# Patient Record
Sex: Male | Born: 1960 | Race: White | Hispanic: No | Marital: Single | Smoking: Light tobacco smoker
Health system: Southern US, Community
[De-identification: ages and names within clinical notes are randomized; demographics above are authoritative.]

## PROBLEM LIST (undated history)

## (undated) DIAGNOSIS — I10 Essential (primary) hypertension: Secondary | ICD-10-CM

## (undated) DIAGNOSIS — E119 Type 2 diabetes mellitus without complications: Secondary | ICD-10-CM

## (undated) DIAGNOSIS — J189 Pneumonia, unspecified organism: Secondary | ICD-10-CM

## (undated) DIAGNOSIS — E785 Hyperlipidemia, unspecified: Secondary | ICD-10-CM

## (undated) HISTORY — PX: LUNG BIOPSY: SHX232

## (undated) HISTORY — DX: Pneumonia, unspecified organism: J18.9

## (undated) HISTORY — DX: Type 2 diabetes mellitus without complications: E11.9

## (undated) HISTORY — DX: Essential (primary) hypertension: I10

## (undated) HISTORY — DX: Hyperlipidemia, unspecified: E78.5

## (undated) HISTORY — PX: TIBIA FRACTURE SURGERY: SHX806

---

## 1997-08-13 ENCOUNTER — Ambulatory Visit (INDEPENDENT_AMBULATORY_CARE_PROVIDER_SITE_OTHER): Admit: 1997-08-13 | Disposition: A | Discharge status: Home / Self Care | Payer: Self-pay | Source: Ambulatory Visit

## 1997-08-27 ENCOUNTER — Encounter: Payer: Self-pay | Admitting: Family Medicine

## 1998-10-27 ENCOUNTER — Emergency Department: Admit: 1998-10-27 | Disposition: A | Discharge status: Home / Self Care | Payer: Self-pay | Source: Ambulatory Visit

## 1998-10-28 ENCOUNTER — Emergency Department: Admit: 1998-10-28 | Disposition: A | Discharge status: Home / Self Care | Payer: Self-pay | Source: Ambulatory Visit

## 2001-09-24 ENCOUNTER — Encounter: Payer: Self-pay | Admitting: Family Medicine

## 2003-08-05 ENCOUNTER — Encounter: Payer: Self-pay | Admitting: Family Medicine

## 2003-11-10 ENCOUNTER — Emergency Department
Admission: EM | Admit: 2003-11-10 | Disposition: A | Discharge status: Home / Self Care | Payer: Self-pay | Source: Ambulatory Visit

## 2005-03-07 ENCOUNTER — Ambulatory Visit: Payer: Self-pay | Admitting: Family Medicine

## 2005-12-19 ENCOUNTER — Ambulatory Visit: Payer: Self-pay | Admitting: Family Medicine

## 2006-08-09 ENCOUNTER — Emergency Department
Admission: EM | Admit: 2006-08-09 | Disposition: A | Discharge status: Home / Self Care | Payer: Self-pay | Source: Ambulatory Visit

## 2006-08-10 ENCOUNTER — Ambulatory Visit
Admission: RE | Admit: 2006-08-10 | Disposition: A | Discharge status: Home / Self Care | Payer: Self-pay | Source: Ambulatory Visit | Admitting: Orthopaedic Surgery

## 2006-08-22 ENCOUNTER — Ambulatory Visit
Admission: RE | Admit: 2006-08-22 | Disposition: A | Discharge status: Home / Self Care | Payer: Self-pay | Source: Ambulatory Visit

## 2007-01-05 ENCOUNTER — Ambulatory Visit: Payer: Self-pay | Admitting: Family Medicine

## 2008-04-05 ENCOUNTER — Encounter: Payer: Self-pay | Admitting: Family Medicine

## 2008-07-31 ENCOUNTER — Encounter: Payer: Self-pay | Admitting: Family Medicine

## 2008-07-31 DIAGNOSIS — E785 Hyperlipidemia, unspecified: Secondary | ICD-10-CM | POA: Insufficient documentation

## 2008-08-04 ENCOUNTER — Ambulatory Visit: Payer: Self-pay | Admitting: Family Medicine

## 2008-08-04 DIAGNOSIS — I10 Essential (primary) hypertension: Secondary | ICD-10-CM | POA: Insufficient documentation

## 2009-01-05 ENCOUNTER — Encounter: Payer: Self-pay | Admitting: Family Medicine

## 2009-02-10 ENCOUNTER — Ambulatory Visit: Payer: Self-pay | Admitting: Family Medicine

## 2009-02-10 LAB — CONVERTED CEMR LAB
Bacteria, UA: 0
Glucose, Urine, Semiquant: NEGATIVE
RBC / HPF: 0
Specific Gravity, Urine: 1.025
pH: 5

## 2009-02-11 ENCOUNTER — Encounter: Payer: Self-pay | Admitting: Family Medicine

## 2009-02-12 ENCOUNTER — Ambulatory Visit: Payer: Self-pay | Admitting: Family Medicine

## 2009-02-13 ENCOUNTER — Encounter: Payer: Self-pay | Admitting: Family Medicine

## 2009-02-16 ENCOUNTER — Telehealth: Payer: Self-pay | Admitting: Family Medicine

## 2009-02-17 LAB — CONVERTED CEMR LAB
ALT: 36 units/L (ref 0–53)
AST: 20 units/L (ref 0–37)
BUN: 13 mg/dL (ref 6–23)
CO2: 28 meq/L (ref 19–32)
Chloride: 107 meq/L (ref 96–112)
Direct LDL: 165.5 mg/dL
Eosinophils Absolute: 0.2 10*3/uL (ref 0.0–0.7)
Eosinophils Relative: 3.8 % (ref 0.0–5.0)
Glucose, Bld: 98 mg/dL (ref 70–99)
MCV: 84.8 fL (ref 78.0–100.0)
Monocytes Absolute: 0.4 10*3/uL (ref 0.1–1.0)
Neutrophils Relative %: 54.6 % (ref 43.0–77.0)
Platelets: 160 10*3/uL (ref 150.0–400.0)
Potassium: 4.5 meq/L (ref 3.5–5.1)
TSH: 3.9 microintl units/mL (ref 0.35–5.50)
Total Bilirubin: 1 mg/dL (ref 0.3–1.2)
WBC: 5 10*3/uL (ref 4.5–10.5)

## 2009-02-24 ENCOUNTER — Ambulatory Visit: Payer: Self-pay | Admitting: Family Medicine

## 2009-10-07 ENCOUNTER — Ambulatory Visit (INDEPENDENT_AMBULATORY_CARE_PROVIDER_SITE_OTHER): Admit: 2009-10-07 | Disposition: A | Discharge status: Home / Self Care | Payer: Self-pay | Source: Ambulatory Visit

## 2010-05-03 ENCOUNTER — Encounter (INDEPENDENT_AMBULATORY_CARE_PROVIDER_SITE_OTHER): Payer: Self-pay | Admitting: *Deleted

## 2010-08-11 ENCOUNTER — Telehealth: Payer: Self-pay | Admitting: Family Medicine

## 2010-08-13 ENCOUNTER — Ambulatory Visit: Payer: Self-pay | Admitting: Family Medicine

## 2010-10-26 NOTE — Assessment & Plan Note (Signed)
Summary: PT TRANSFER FROM DR SCHALLER/CHECK   Vital Signs:  Patient profile:   50 year old male Height:      72 inches Weight:      266 pounds BMI:     36.21 Temp:     97.6 degrees F oral Pulse rate:   72 / minute Pulse rhythm:   regular BP sitting:   130 / 90  (left arm) Cuff size:   large  Vitals Entered By: Linde Gillis CMA Duncan Dull) (August 13, 2010 2:59 PM) CC: transfer from Dr. Hetty Ely   History of Present Illness: Here with his wife to fill out papers for [employer] agency. He feels well with no complaints.   HTN- stopped taking his atenolol.  Walking more, cut salt out of his diet.  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  Less sedentary and making better food choices.  Weight stable.  No HA, blurred vision, CP or SOB.  Current Medications (verified): 1)  Atenolol 50 Mg Tabs (Atenolol) .... One Tab By Mouth At Night.  Allergies (verified): No Known Drug Allergies  Past History:  Past Medical History: Last updated: 07/31/2008 Hyperlipidemia(: 02/1997)  Past Surgical History: Last updated: 02-22-09 HOSP Pneumonia as child (1977)  Family History: Last updated: 22-Feb-2009 Father: DECEASED AT AGE 65 BLACK LUNG-- COAL MINER Mother: A 76  DM   Bipolar/ Schizoaffective/ Organic brain syndrome Dementia Brother 49 HTN COPD  Bipolar CHOL  Brother A 42 HTN COPD Chol DM  1/2Sister dec  30s Cancer, unknown type  Obese CV: +MOTHER MI ; +FATHER CAD HBP: + MOTHER DM: + MOTHER GOUT/ARTHRITIS:  PROSTATE CANCER: BREAST/OVARIAN/UTERINE CANCER: 1 1/2 SISTER DIED  COLON CANCER:  DEPRESSION: + MOTHER  ETOH/DRUG ABUSE: NEGATIVE OTHER: NEGATIVE STROKE  Social History: Last updated: 02-22-2009 Marital Status: Married LIVES WITH WIFE Children: NONE Occupation: CNA, former Public affairs consultant  2010  Risk Factors: Caffeine Use: 3 (Feb 22, 2009) Exercise: yes (02-22-2009)  Risk Factors: Smoking Status: never (Feb 22, 2009) Passive Smoke  Exposure: no (22-Feb-2009)  Review of Systems      See HPI General:  Denies malaise. Eyes:  Denies blurring. ENT:  Denies difficulty swallowing. CV:  Denies chest pain or discomfort. Resp:  Denies shortness of breath. GI:  Denies abdominal pain and change in bowel habits. GU:  Denies urinary frequency and urinary hesitancy. MS:  Denies joint pain, joint redness, and joint swelling. Derm:  Denies rash. Neuro:  Denies headaches. Psych:  Denies anxiety and depression. Endo:  Denies cold intolerance and heat intolerance. Heme:  Denies abnormal bruising and bleeding.  Physical Exam  General:  Well-developed,well-nourished,in no acute distress; alert,appropriate and cooperative throughout examination Head:  Normocephalic and atraumatic without obvious abnormalities. No apparent alopecia or balding. Eyes:  Conjunctiva clear bilaterally.  Ears:  External ear exam shows no significant lesions or deformities.  Otoscopic examination reveals clear canals, tympanic membranes are intact bilaterally without bulging, retraction, inflammation or discharge. Hearing is grossly normal bilaterally. Minimal cerumen bilat. Nose:  External nasal examination shows no deformity or inflammation. Nasal mucosa are pink and moist without lesions or exudates. Mouth:  Oral mucosa and oropharynx without lesions or exudates.  Teeth in good repair. Neck:  No deformities, masses, or tenderness noted. Lungs:  Normal respiratory effort, chest expands symmetrically. Lungs are clear to auscultation, no crackles or wheezes. Heart:  Normal rate and regular rhythm. S1 and S2 normal without gallop, murmur, click, rub or other extra sounds. Abdomen:  Bowel sounds positive,abdomen soft and non-tender without masses, organomegaly or hernias noted. Msk:  No deformity or scoliosis noted of thoracic or lumbar spine.   Extremities:  No clubbing, cyanosis, edema, or deformity noted with normal full range of motion of all joints.     Neurologic:  No cranial nerve deficits noted. Station and gait are normal. Plantar reflexes are down-going bilaterally. DTRs are symmetrical throughout. Sensory, motor and coordinative functions appear intact. Skin:  Intact without suspicious lesions or rashes Psych:  Cognition and judgment appear intact. Alert and cooperative with normal attention span and concentration. No apparent delusions, illusions, hallucinations   Impression & Recommendations:  Problem # 1:  OTH GENERAL MEDICAL EXAMINATION ADMIN PURPOSES (ICD-V70.3)  Form completed in office today.  Orders: Form Completion (44010)  Problem # 2:  ESSENTIAL HYPERTENSION (ICD-401.9) Assessment: Improved Much improved with lifestyle changes, off meds. The following medications were removed from the medication list:    Atenolol 50 Mg Tabs (Atenolol) ..... One tab by mouth at night.   Orders Added: 1)  Est. Patient Level III [27253] 2)  Form Completion [66440]    Current Allergies (reviewed today): No known allergies

## 2010-10-26 NOTE — Miscellaneous (Signed)
Summary: [facility]  [facility]   Imported By: Beau Fanny 02/17/2009 10:30:26  _____________________________________________________________________  External Attachment:    Type:   Image     Comment:   External Document

## 2010-10-26 NOTE — Progress Notes (Signed)
  Phone Note Call from Patient Call back at [phone]   Caller: Gala Murdoch Call For: Dr.Aron Summary of Call: Pt's wife transferred from Dr.Schaller to you.  Pt's wife asked if her husband can switch to you,also,since she sees you.  It's time for pt. to get his blood pressure checked.  Please Advise. Initial call taken by: Beau Fanny,  August 11, 2010 4:10 PM  Follow-up for Phone Call        yes that is fine. Ruthe Mannan MD  August 12, 2010 7:30 AM   Additional Follow-up for Phone Call Additional follow up Details #1::        L/m on answering machine to let pt. know Dr.Aron will see him. Additional Follow-up by: Beau Fanny,  August 12, 2010 8:33 AM

## 2010-10-26 NOTE — Letter (Signed)
Summary: Nadara Eaton letter  Pablo Pena at [facility]  [address] [place], [place] [address]   Phone: [phone]  Fax: [phone]       05/03/2010 MRN: [id number]  RAINN BULLINGER [address] [place], [place]  [address]  Dear Mr. AMSLER,  [facility], and [facility] announce the retirement of Arta Silence, M.D., from full-time practice at the [facility] office effective March 25, 2010 and his plans of returning part-time.  It is important to Dr. Hetty Ely and to our practice that you understand that [facility] has seven physicians in our office for your health care needs.  We will continue to offer the same exceptional care that you have today.    Dr. Hetty Ely has spoken to many of you about his plans for retirement and returning part-time in the fall.   We will continue to work with you through the transition to schedule appointments for you in the office and meet the high standards that [place] is committed to.   Again, it is with great pleasure that we share the news that Dr. Hetty Ely will return to [facility] in October of 2011 with a reduced schedule.    If you have any questions, or would like to request an appointment with one of our physicians, please call us at [phone] and press the option for Scheduling an appointment.  We take pleasure in providing you with excellent patient care and look forward to seeing you at your next office visit.  Our [facility] Physicians are:  Tillman Abide, M.D. Laurita Quint, M.D. Roxy Manns, M.D. Kerby Nora, M.D. Hannah Beat, M.D. Ruthe Mannan, M.D. We proudly welcomed Raechel Ache, M.D. and Eustaquio Boyden, M.D. to the practice in July/August 2011.  Sincerely,  [facility]

## 2010-11-15 ENCOUNTER — Telehealth: Payer: Self-pay | Admitting: Family Medicine

## 2010-11-23 NOTE — Progress Notes (Signed)
Summary: call a nurse  Phone Note Call from Patient   Summary of Call: Triage Record Num: [id number] Operator: Freddie Breech Patient Name: Riley Howard Call Date & Time: 11/13/2010 9:41:55AM Patient Phone: [phone] PCP: Ruthe Mannan Patient Gender: Male PCP Fax : [phone] Patient DOB: 28-Mar-1961 Practice Name: [facility] Reason for Call: Lisa/wife is calling as pt has diarrhea and vomiting onset 11/12/10. Vomited x 6, diarrhea x 12 since last PM. Drinking water, voiding. Home care advised per nausea/vomiting protocol. Call back parameters reviewed. Protocol(s) Used: Nausea or Vomiting Recommended Outcome per Protocol: Provide Home/Self Care Reason for Outcome: New onset of 3-4 episodes vomiting or diarrhea following mild abdominal cramping Care Advice: Antidiarrheal medications are usually unnecessary. Use only after consulting your provider. Application of A&D ointment or witch hazel medicated pads may help relieve anal irritation.  ~  ~ Call provider if symptoms do not improve after 24 hours of home care. Call provider immediately if develop severe pain, black, tarry stools, bloody stools, blood-streaked or coffee ground-looking vomitus, or abdomen swollen.  ~  ~ SYMPTOM / CONDITION MANAGEMENT Go to the ED if you have developed signs and symptoms of dehydration such as very dry mouth and tongue; increased pulse rate at rest; no urine output for 8 hours or more; increasing weakness or drowsiness, or lightheadedness when trying to sit upright or standing.  ~ Vomiting and Diarrhea Care: - Do not eat solid foods until vomiting subsides. - Begin taking fluids by sucking on ice chips or popsicles or taking sips of cool, clear fluids (soda, fruit juices that are low acid, sports drinks or nonprescription oral rehydration solution). - Gradually drink larger amounts of these fluids so that you are drinking six to eight 8 oz. (1.2 to 1.6 liters) of fluids a day. -  Keep activity to a minimum. - Once vomiting and diarrhea subside, eat smaller, more frequent meals of easily digested foods such as c Initial call taken by: Melody Comas,  November 15, 2010 8:12 AM     Please call to check on him. Ruthe Mannan MD  November 15, 2010 8:14 AM  Left message on spouse, Misty Stanley) cell phone voicemail for patient to return call.  Linde Gillis CMA Duncan Dull)  November 15, 2010 11:25 AM   Spoke with patient via telephone and he states that he is feeling much better.  No more nausea or vomiting.  He started back eating on yesterday.  States that he feels better than he as felt in a while.  Linde Gillis CMA Duncan Dull)  November 15, 2010 2:04 PM

## 2011-06-05 ENCOUNTER — Emergency Department
Admission: EM | Admit: 2011-06-05 | Disposition: A | Discharge status: Home / Self Care | Payer: Self-pay | Source: Ambulatory Visit

## 2013-04-11 ENCOUNTER — Telehealth: Payer: Self-pay | Admitting: Family Medicine

## 2013-04-11 NOTE — Telephone Encounter (Signed)
Patient wants to switch from Dr.Aron to you.  Patient's wife,Deborah,is a patient of yours.  Patient went to Urgent Care and his blood pressure 220/130 and he was given medication at the Urgent Care.  Can patient switch to you?

## 2013-04-12 NOTE — Telephone Encounter (Signed)
Ok with me.  I have only seen him one time (in 2011).

## 2013-04-12 NOTE — Telephone Encounter (Signed)
Okay with me if okay with Dayton Martes.  Schedule a f/u.  Thanks.  if possible.

## 2013-04-16 ENCOUNTER — Encounter: Payer: Self-pay | Admitting: Family Medicine

## 2013-04-17 ENCOUNTER — Ambulatory Visit (INDEPENDENT_AMBULATORY_CARE_PROVIDER_SITE_OTHER): Payer: BC Managed Care – PPO | Admitting: Family Medicine

## 2013-04-17 ENCOUNTER — Encounter: Payer: Self-pay | Admitting: Family Medicine

## 2013-04-17 VITALS — BP 142/90 | HR 76 | Temp 97.8°F | Ht 71.5 in | Wt 277.5 lb

## 2013-04-17 DIAGNOSIS — I10 Essential (primary) hypertension: Secondary | ICD-10-CM

## 2013-04-17 MED ORDER — ATENOLOL 50 MG PO TABS: 50.0000 mg | ORAL_TABLET | Freq: Every day | ORAL | Status: DC

## 2013-04-17 NOTE — Patient Instructions (Addendum)
Schedule a lab visit- fasting.  We'll contact you with your lab report. Work on diet and exercise- pick one and start with that.  Look at heart.org for ideas.  Schedule a physical for January or February of 2015.  Take care.

## 2013-04-17 NOTE — Progress Notes (Signed)
Prev seen, in distant past.  To est with me today.   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  Seen at [facility] and started on [facility].    Using medication without problems or lightheadedness: yes Chest pain with exertion:no Edema:no Short of breath:no Due for labs.  Discussed.   Meds, vitals, and allergies reviewed.   PMH and SH reviewed  ROS: See HPI.  Otherwise negative.    GEN: nad, alert and oriented HEENT: mucous membranes moist NECK: supple w/o LA CV: rrr. PULM: ctab, no inc wob ABD: soft, +bs EXT: no edema SKIN: no acute rash

## 2013-04-17 NOTE — Assessment & Plan Note (Signed)
Improved, d/w pt about diet, exercise, weight.  DASH diet, continue meds.  Return for labs.  CPE in 2015. He agrees.

## 2013-04-25 ENCOUNTER — Other Ambulatory Visit (INDEPENDENT_AMBULATORY_CARE_PROVIDER_SITE_OTHER): Payer: BC Managed Care – PPO

## 2013-04-25 ENCOUNTER — Encounter: Payer: Self-pay | Admitting: *Deleted

## 2013-04-25 DIAGNOSIS — E785 Hyperlipidemia, unspecified: Secondary | ICD-10-CM

## 2013-04-25 DIAGNOSIS — I10 Essential (primary) hypertension: Secondary | ICD-10-CM

## 2013-04-25 LAB — BASIC METABOLIC PANEL
CO2: 28 mEq/L (ref 19–32)
Chloride: 107 mEq/L (ref 96–112)
Glucose, Bld: 112 mg/dL — ABNORMAL HIGH (ref 70–99)
Potassium: 5 mEq/L (ref 3.5–5.1)
Sodium: 140 mEq/L (ref 135–145)

## 2013-04-25 LAB — LIPID PANEL
HDL: 42.9 mg/dL (ref >39.00)
Total CHOL/HDL Ratio: 6
VLDL: 23.6 mg/dL (ref 0.0–40.0)

## 2013-04-25 LAB — LDL CHOLESTEROL, DIRECT: Direct LDL: 189.7 mg/dL

## 2014-04-10 ENCOUNTER — Ambulatory Visit (INDEPENDENT_AMBULATORY_CARE_PROVIDER_SITE_OTHER): Payer: BC Managed Care – PPO | Admitting: Family Medicine

## 2014-04-10 ENCOUNTER — Encounter: Payer: Self-pay | Admitting: Family Medicine

## 2014-04-10 VITALS — BP 140/92 | HR 68 | Temp 97.6°F | Wt 298.8 lb

## 2014-04-10 DIAGNOSIS — E669 Obesity, unspecified: Secondary | ICD-10-CM

## 2014-04-10 DIAGNOSIS — R5383 Other fatigue: Principal | ICD-10-CM

## 2014-04-10 DIAGNOSIS — R5381 Other malaise: Secondary | ICD-10-CM

## 2014-04-10 MED ORDER — ATENOLOL 50 MG PO TABS: 50.0000 mg | ORAL_TABLET | Freq: Every day | ORAL | Status: DC

## 2014-04-10 NOTE — Assessment & Plan Note (Signed)
Likely from work schedule, sleep disruption.  No definite sx of OSA.  D/w pt.  Needs weight loss.  See below.  Check basic labs today.  He agrees.

## 2014-04-10 NOTE — Assessment & Plan Note (Addendum)
D/w pt about weight loss with diet and exercise, packing lunches, healthy portable meals given his work situation.  He agrees.  He'll tally his calories in a day and cut 5% initially.  He agrees. Edema likely exacerbated by weight and dependent position.  dw pt.

## 2014-04-10 NOTE — Progress Notes (Signed)
Pre visit review using our clinic review tool, if applicable. No additional management support is needed unless otherwise documented below in the visit note.  Fatigue.  "Tired all the time."  Snoring a lot but desn't usually wake from snoring.  Doesn't wake up gasping for air.  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  Trucker.    Ankle rash.  Noted in the last 6 months.  Itchy.  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  Variable diet and sleep patterns with variable work schedule.    Compliant with BB.   Meds, vitals, and allergies reviewed.   ROS: See HPI.  Otherwise, noncontributory.  GEN: nad, alert and oriented, overweight.  HEENT: mucous membranes moist NECK: supple w/o LA CV: rrr.  no murmur PULM: ctab, no inc wob ABD: soft, +bs EXT: trace edema with mild chronic hemosiderin changes noted.  SKIN: no acute rash

## 2014-04-10 NOTE — Patient Instructions (Signed)
Go to the lab on the way out.  We'll contact you with your lab report.  Total your calories and then cut 5%.  Start packing some healthy food on your outbound trips.  We'll be in touch. Take care. Glad to see you.

## 2014-04-11 LAB — CBC WITH DIFFERENTIAL/PLATELET
Basophils Absolute: 0.2 10*3/uL — ABNORMAL HIGH (ref 0.0–0.1)
Basophils Relative: 3.1 % — ABNORMAL HIGH (ref 0.0–3.0)
EOS ABS: 0.4 10*3/uL (ref 0.0–0.7)
EOS PCT: 6.1 % — AB (ref 0.0–5.0)
HEMATOCRIT: 46.8 % (ref 39.0–52.0)
Hemoglobin: 16 g/dL (ref 13.0–17.0)
LYMPHS ABS: 2.4 10*3/uL (ref 0.7–4.0)
Lymphocytes Relative: 33.5 % (ref 12.0–46.0)
MCHC: 34.1 g/dL (ref 30.0–36.0)
MCV: 85.9 fl (ref 78.0–100.0)
MONO ABS: 0.6 10*3/uL (ref 0.1–1.0)
Monocytes Relative: 8.2 % (ref 3.0–12.0)
Neutro Abs: 3.5 10*3/uL (ref 1.4–7.7)
Neutrophils Relative %: 49.1 % (ref 43.0–77.0)
PLATELETS: 192 10*3/uL (ref 150.0–400.0)
RBC: 5.46 Mil/uL (ref 4.22–5.81)
RDW: 12.6 % (ref 11.5–15.5)
WBC: 7.2 10*3/uL (ref 4.0–10.5)

## 2014-04-11 LAB — BASIC METABOLIC PANEL
BUN: 19 mg/dL (ref 6–23)
CHLORIDE: 104 meq/L (ref 96–112)
CO2: 24 meq/L (ref 19–32)
CREATININE: 1.3 mg/dL (ref 0.4–1.5)
Calcium: 9.2 mg/dL (ref 8.4–10.5)
GFR: 61.55 mL/min (ref >60.00)
GLUCOSE: 93 mg/dL (ref 70–99)
POTASSIUM: 4.5 meq/L (ref 3.5–5.1)
Sodium: 136 mEq/L (ref 135–145)

## 2014-04-11 LAB — TSH: TSH: 1.04 u[IU]/mL (ref 0.35–4.50)

## 2014-04-14 ENCOUNTER — Encounter: Payer: Self-pay | Admitting: *Deleted

## 2014-05-02 ENCOUNTER — Encounter: Payer: Self-pay | Admitting: Family Medicine

## 2014-05-02 ENCOUNTER — Ambulatory Visit (INDEPENDENT_AMBULATORY_CARE_PROVIDER_SITE_OTHER): Payer: BC Managed Care – PPO | Admitting: Family Medicine

## 2014-05-02 VITALS — BP 140/84 | HR 71 | Temp 97.9°F | Wt 295.2 lb

## 2014-05-02 DIAGNOSIS — R82998 Other abnormal findings in urine: Secondary | ICD-10-CM

## 2014-05-02 DIAGNOSIS — R319 Hematuria, unspecified: Secondary | ICD-10-CM

## 2014-05-02 DIAGNOSIS — R8290 Unspecified abnormal findings in urine: Secondary | ICD-10-CM

## 2014-05-02 LAB — POCT URINALYSIS DIPSTICK
BILIRUBIN UA: NEGATIVE
Glucose, UA: NEGATIVE
Ketones, UA: NEGATIVE
Leukocytes, UA: NEGATIVE
NITRITE UA: NEGATIVE
PH UA: 5.5
PROTEIN UA: NEGATIVE
RBC UA: NEGATIVE
Spec Grav, UA: >=1.03
Urobilinogen, UA: 0.2

## 2014-05-02 NOTE — Patient Instructions (Signed)
Your urine looked fine.  I think you may have had a false positive, and that happens occasionally.  Take care.

## 2014-05-02 NOTE — Progress Notes (Signed)
Pre visit review using our clinic review tool, if applicable. No additional management support is needed unless otherwise documented below in the visit note.  Lifelong nonsmoker with trace blood in urine at the [employer] physical on 04/29/14.  No gross blood, ever.  This was the only dip with heme noted, has had u/a dip done prev at [facility].  No h/o renal stones.  No h/o renal disease.  H/o controlled HTN.  No dysuria.    Meds, vitals, and allergies reviewed.   ROS: See HPI.  Otherwise, noncontributory.  nad ncat Mmm rrr ctab abd soft, not ttp Ext with no edema

## 2014-05-04 DIAGNOSIS — R8290 Unspecified abnormal findings in urine: Secondary | ICD-10-CM | POA: Insufficient documentation

## 2014-05-04 NOTE — Assessment & Plan Note (Signed)
[employer] dip with trace heme, normal today.  No sx.  D/w pt.  Likely false positive.  Pt is not high risk at all.  F/u if any changes noted by patient.  O/w no further w/u.  He agrees.  D/w pt.

## 2015-04-30 ENCOUNTER — Encounter: Payer: Self-pay | Admitting: Family Medicine

## 2015-04-30 ENCOUNTER — Ambulatory Visit (INDEPENDENT_AMBULATORY_CARE_PROVIDER_SITE_OTHER): Payer: BLUE CROSS/BLUE SHIELD | Admitting: Family Medicine

## 2015-04-30 VITALS — BP 130/84 | HR 63 | Temp 98.3°F | Ht 72.0 in | Wt 294.2 lb

## 2015-04-30 DIAGNOSIS — Z0000 Encounter for general adult medical examination without abnormal findings: Secondary | ICD-10-CM

## 2015-04-30 DIAGNOSIS — I10 Essential (primary) hypertension: Secondary | ICD-10-CM

## 2015-04-30 DIAGNOSIS — Z1211 Encounter for screening for malignant neoplasm of colon: Secondary | ICD-10-CM

## 2015-04-30 DIAGNOSIS — Z7189 Other specified counseling: Secondary | ICD-10-CM

## 2015-04-30 MED ORDER — ATENOLOL 50 MG PO TABS: 50.0000 mg | ORAL_TABLET | Freq: Every day | ORAL | Status: DC

## 2015-04-30 NOTE — Progress Notes (Signed)
Pre visit review using our clinic review tool, if applicable. No additional management support is needed unless otherwise documented below in the visit note.  CPE- See plan.  Routine anticipatory guidance given to patient.  See health maintenance. Tetanus 2009 Flu encouraged.  PNA not due Shingles not due D/w patient [id number] for colon cancer screening, including IFOB vs. colonoscopy.  Risks and benefits of both were discussed and patient voiced understanding.  Pt elects HQI:ONGE.  Prostate cancer screening and PSA options (with potential risks and benefits of testing vs not testing) were discussed along with recent recs/guidelines.  He declined testing PSA at this point. Diet and exercise d/w pt.  He's trying to work on both.  He's doing better with exercise, mainly yard work.  Complicated by his work schedule, d/w pt.   Living will d/w pt.  Wife designated if patient were incapacitated.   Hypertension:    Using medication without problems or lightheadedness: yes Chest pain with exertion:no Edema:not unless prolonged driving.  No change per patient.  Short of breath:no  Occ hand tingling.  Positional.  Likely from diving.  Has a contracture on the L hand.  No dec in ROM.    PMH and SH reviewed  Meds, vitals, and allergies reviewed.   ROS: See HPI.  Otherwise negative.    GEN: nad, alert and oriented HEENT: mucous membranes moist NECK: supple w/o LA CV: rrr. PULM: ctab, no inc wob ABD: soft, +bs EXT: no edema SKIN: no acute rash

## 2015-04-30 NOTE — Patient Instructions (Addendum)
I would get a flu shot each fall.   Try a cock up wrist brace for the tingling in your hand.  See if that helps.   Go to the lab on the way out.  We'll contact you with your lab report (stool cards). Schedule a fasting lab visit.

## 2015-05-01 DIAGNOSIS — Z0000 Encounter for general adult medical examination without abnormal findings: Secondary | ICD-10-CM | POA: Insufficient documentation

## 2015-05-01 DIAGNOSIS — Z7189 Other specified counseling: Secondary | ICD-10-CM | POA: Insufficient documentation

## 2015-05-01 NOTE — Assessment & Plan Note (Signed)
Routine anticipatory guidance given to patient. See health maintenance.  Tetanus 2009  Flu encouraged.  PNA not due  Shingles not due  D/w patient [id number] for colon cancer screening, including IFOB vs. colonoscopy. Risks and benefits of both were discussed and patient voiced understanding. Pt elects [id number].  Prostate cancer screening and PSA options (with potential risks and benefits of testing vs not testing) were discussed along with recent recs/guidelines. He declined testing PSA at this point.  Diet and exercise d/w pt. He's trying to work on both. He's doing better with exercise, mainly yard work. Complicated by his work schedule, d/w pt.  Living will d/w pt. Wife designated if patient were incapacitated.

## 2015-05-01 NOTE — Assessment & Plan Note (Signed)
Continue med as is.  D/w pt.  Needs work on weight loss, d/w pt re: diet and exercise. Return for fasting labs.  He agrees.

## 2015-05-05 ENCOUNTER — Other Ambulatory Visit: Payer: BLUE CROSS/BLUE SHIELD

## 2015-06-05 ENCOUNTER — Emergency Department
Admission: EM | Admit: 2015-06-05 | Discharge: 2015-06-05 | Disposition: A | Discharge status: Home / Self Care | Payer: Self-pay | Attending: Emergency Medicine | Admitting: Emergency Medicine

## 2015-06-05 ENCOUNTER — Emergency Department: Payer: Self-pay

## 2015-06-05 DIAGNOSIS — K088 Other specified disorders of teeth and supporting structures: Secondary | ICD-10-CM | POA: Insufficient documentation

## 2015-06-05 DIAGNOSIS — K0889 Other specified disorders of teeth and supporting structures: Secondary | ICD-10-CM

## 2015-06-05 MED ORDER — ACETAMINOPHEN 500 MG PO TABS
ORAL_TABLET | ORAL | Status: AC
Start: 2015-06-05 — End: ?
  Filled 2015-06-05: qty 2

## 2015-06-05 MED ORDER — AMOXICILLIN 500 MG PO CAPS
1000.0000 mg | ORAL_CAPSULE | Freq: Two times a day (BID) | ORAL | Status: AC
Start: 2015-06-05 — End: 2015-06-13

## 2015-06-05 NOTE — ED Provider Notes (Signed)
Physician/Midlevel provider first contact with patient: 06/05/15 0944         History     Chief Complaint   Patient presents with   . Nasal Congestion   . Dental Pain     Patient is a 54 y.o. male presenting with tooth pain. The history is provided by the patient.   Dental Pain  Associated symptoms: congestion     One week pain in right upper tooth remnant, with pain radiating to right maxillary area.  Has not noted swelling. Had chills three days ago but none since.  No fever.  No cough.  No sob.  Feels otherwise well.          History reviewed. No pertinent past medical history.    Past Surgical History   Procedure Laterality Date   . Lung biopsy         Family History   Problem Relation Age of Onset   . Heart disease Mother    . Kidney failure Sister    . Heart disease Brother        Social  Social History   Substance Use Topics   . Smoking status: Light Tobacco Smoker   . Smokeless tobacco: None   . Alcohol Use: No       .     Allergies   Allergen Reactions   . Sulfa Antibiotics        Home Medications           No Medications           Review of Systems   Constitutional: Negative.  Negative for activity change and appetite change.   HENT: Positive for congestion, dental problem and sinus pressure. Negative for rhinorrhea.    Neurological: Negative.    All other systems reviewed and are negative.      Physical Exam    BP: (!) 146/97 mmHg, Heart Rate: 95, Temp: 97.7 F (36.5 C), Resp Rate: 16, SpO2: 97 %, Weight: 70.308 kg    Physical Exam   Constitutional: He is oriented to person, place, and time. He appears well-developed and well-nourished. No distress.   HENT:   Right upper incisor consists of two tiny brown spikes, one of which is exquisitely ttp.  There is gingival erythema but no abscess.  No swelling or lad.  He has pain lower maxillary area over tooth but there is no visible swelling and no cellulitis.    No nasal discharge, no discharge seen with nasal speculum.     Neurological: He is alert and  oriented to person, place, and time.   Skin: No rash noted.   No cellulitis     Psychiatric: He has a normal mood and affect.   Nursing note and vitals reviewed.        MDM and ED Course     ED Medication Orders     None             MDM          Procedures    Clinical Impression & Disposition     Clinical Impression  Final diagnoses:   Pain, dental        ED Disposition     Discharge Sean Berg discharge to home/self care.    Condition at disposition: Stable             New Prescriptions    AMOXICILLIN (AMOXIL) 500 MG CAPSULE    Take 2 capsules (1,000 mg total)  by mouth 2 (two) times daily.                   Nicolasa Ducking, MD  06/05/15 1000

## 2015-06-05 NOTE — ED Notes (Signed)
Nasal congestion with dental pain.

## 2015-06-05 NOTE — Discharge Instructions (Signed)
Dental Pain    A crack or cavity in a tooth can cause tooth pain. This is because the crack or cavity exposes the sensitive inner area of the tooth. An infection in the gum or the root of the tooth can cause pain and swelling. The pain is often made worse when you drink hot or cold beverages. It can also be worse when you bite on hard foods. Pain may spread from the tooth to your ear or the area of the jaw on the same side.  Home care  Follow these tips when caring for yourself at home:   Avoid hot and cold foods and drinks. Your tooth may be sensitive to changes in temperature.   Use toothpaste made for sensitive teeth. Brush up and down instead of sideways. Brushing sideways can wear away root surfaces if they are exposed.   If your tooth is chipped or cracked, or if there is a large open cavity, put oil of cloves directly on the tooth to relieve pain. You can buy oil of cloves at drugstores. Some pharmacies carry an over-the-counter "toothache kit." This contains a paste that you can put on the exposed tooth to make it less sensitive.   Put a cold pack on your jaw over the sore area to help reduce pain.   You may use acetaminophen or ibuprofen to ease pain, unless another medicine was prescribed. Note: If you have chronic liver or kidney disease, talk with your health care provider before using these medicines. Also talk with your provider if you've had a stomach ulcer or GI bleeding.   If you have signs of an infection, you will be given an antibiotic. Take it as directed.  Follow-up care  Follow up with your dentist as advised. Your pain may go away with the treatment given today. But only a dentist can fully look at and treat the cause of your pain. This will keep the pain from coming back.  A toothache is a sign of disease in your tooth. It should be looked at and treated by a dentist.  When to seek medical advice  Call your health care provider right awayif any of these occur:   Your face becomes  swollen or red   Pain gets worse or spreads to your neck   Fever over 100.4 F (38.0 C)   Unusual drowsiness   Headache or stiff neck   Weakness or fainting   Pus drains from the tooth   Difficulty swallowing or breathing     2000-2015 [employer] [address], [place] [address]. All rights reserved. This information is not intended as a substitute for professional medical care. Always follow your healthcare professional's instructions.      Thank you for choosing [facility] for your emergency care needs. We strive to provide EXCELLENT care to you and your family.  YOUR ACCURATE CONTACT INFORMATION IS VERY IMPORTANT  Before leaving please check with registration to make sure we have an up-to-date contact number. A Toll-free post discharge Customer Service number is available to update your registration/insurance information as well as answer any billing questions or concerns. That number is [phone]   Discharge Message  YOU ARE THE MOST IMPORTANT FACTOR IN YOUR RECOVERY. Follow the above instructions carefully. Take your medicines as prescribed. Most important, see your  doctor in follow-up  as recommended by your ED physician    IF YOU DO NOT CONTINUE TO IMPROVE OR YOU HAVE ANY NEW,   WORSENING O SEVERE SYMPTOMS, PLEASE CONTACT YOUR DOCTOR   IF YOUR REQUIRE IMMEDIATE ASSISTANCE, RETURN TO THE EMERGENCEY DEPARTMENT OR CALL 911.  MEDICAL RECORDS AND TESTS  Certain laboratory test results do not come back the same day, for example: urine cultures may take 3 days. We will attempt to contact you if other important findings are noted. Some lab testing may take 2-5 days. Radiology films are reviewed again to ensure accuracy. If there is any discrepancy, we will notify you.     EXTRA AVAILABLE RESOURCES:  1. DOCTOR REFERRALS  a. Call  our Physician Referral Line at [phone]   b. [email or web address].  For physician referrals and other services that [facility] offers.    2. FREE HEALTH SERVICES  a. www.freemedicalsearch.org  b. http://[email or web address]  May be utilized if you need help with health or social services, please call 2-1-1 for a free referral to resources in your area. 2-1-1 is a free service connecting people with information on health insurance, free clinics, pregnancy, mental health, dental care, food assistance, housing, and substance abuse counseling.  Pharmacy information  Prescriptions can be filled at the pharmacy of your choice.  The Emergency Department does not authorize prescription refills.  Please contact your primary care physician or clinic for this.    [facility] has been providing home care solutions for independent living since 1984. Servicing [place] and [place]. [facility] is a full service home medical provider of home oxygen and respiratory care, medical equipment and supplies.  [phone]    Thanks Again, for allowing   [facility]   to serve you.  [phone]

## 2015-06-22 ENCOUNTER — Emergency Department: Payer: Self-pay

## 2015-06-22 ENCOUNTER — Emergency Department
Admission: EM | Admit: 2015-06-22 | Discharge: 2015-06-22 | Disposition: A | Discharge status: Home / Self Care | Payer: Self-pay | Attending: Emergency Medicine | Admitting: Emergency Medicine

## 2015-06-22 DIAGNOSIS — K047 Periapical abscess without sinus: Secondary | ICD-10-CM | POA: Insufficient documentation

## 2015-06-22 MED ORDER — CLINDAMYCIN HCL 300 MG PO CAPS
300.0000 mg | ORAL_CAPSULE | Freq: Three times a day (TID) | ORAL | Status: AC
Start: 2015-06-22 — End: 2015-07-02

## 2015-06-22 MED ORDER — HYDROCODONE-ACETAMINOPHEN 5-300 MG PO TABS
1.0000 | ORAL_TABLET | Freq: Four times a day (QID) | ORAL | Status: DC | PRN
Start: 2015-06-22 — End: 2018-01-11

## 2015-06-22 NOTE — Discharge Instructions (Signed)
Dental Abscess  An abscess is a sac of pus. A dental abscess forms when a tooth or the tissue around it becomes infected with bacteria. The bacteria can enter through a cavity or a crack in a tooth. It can also infect the gum tissue or bone around a tooth. An untreated abscess can cause the loss of the tooth. It can even spread to other parts of the body and become life-threatening.    Symptoms of a dental abscess  Signs of a dental abscess include:   Toothache, often severe   Tooth pain with hot, cold, or pressure   Pain in the gums, cheek, or jaw   Bad breath or bitter taste in the mouth   Trouble swallowing or opening the mouth   Fever   Swollen or enlarged glands in the neck  Diagnosing a dental abscess  An abscess is diagnosed by looking at your teeth and gums. You will be told if any tests, like dental X-rays, are needed.  Treating a dental abscess  Treatments for a dental abscess may include the following:   Antibiotic medicines to treat the underlying infection.   Pain relievers to help you feel more comfortable. Your health care provider may prescribe a medicine for you. Or, use over-the-counter pain relievers, like acetaminophen or ibuprofen.   Warm saltwater rinses to soothe discomfort and help clear away pus.   Root canal surgery if needed to save the tooth. With a root canal, the infected part of the tooth is removed. A special substance is then used to fill the empty space in the tooth.   Drainage of the abscess if needed. Incisions are made to allow the infected material to drain from the tooth.   Removal of the tooth in cases of severe infection that can't be treated another way.  If the infection is severe, has spread, or doesn't respond to treatment, you may need to be admitted to a hospital.         Preventing dental abscess  To prevent another abscess in the future, keep your teeth clean and healthy. Brush twice a day and floss at least once daily. See your dentist for regular  tooth cleanings. And avoid sugary foods and drinks that can lead to tooth decay.   2000-2015 [employer] [address], [place] [address]. All rights reserved. This information is not intended as a substitute for professional medical care. Always follow your healthcare professional's instructions.

## 2015-06-22 NOTE — ED Provider Notes (Signed)
Physician/Midlevel provider first contact with patient: 06/22/15 1151         History     Chief Complaint   Patient presents with   . Facial Swelling   patient is here with facial swelling, and a foul taste in his mouth from gum swelling, right upper maxillary tooth. The patient has multiply carious teeth rotten to the gum. No fevers reported, he has swelling and pain up to below his right eye. No eye pain or diplopia. No headache or fever.          History reviewed. No pertinent past medical history.    Past Surgical History   Procedure Laterality Date   . Lung biopsy         Family History   Problem Relation Age of Onset   . Heart disease Mother    . Kidney failure Sister    . Heart disease Brother        Social  Social History   Substance Use Topics   . Smoking status: Light Tobacco Smoker   . Smokeless tobacco: None   . Alcohol Use: No       .     Allergies   Allergen Reactions   . Sulfa Antibiotics        Home Medications     Last Medication Reconciliation Action:  Complete Melven Sartorius, RN 06/22/2015 11:08 AM          No Medications           Review of Systems   Constitutional: Negative for fever and chills.   HENT: Positive for dental problem and facial swelling. Negative for ear pain, sinus pressure and sore throat.        Physical Exam    BP: (!) 158/107 mmHg, Heart Rate: 93, Temp: 97.8 F (36.6 C), Resp Rate: 18, SpO2: 98 %, Weight: 72.576 kg     Physical Exam   Constitutional: He appears well-developed and well-nourished. He appears distressed.   HENT:   Head: Normocephalic.   Right Ear: External ear normal.   Left Ear: External ear normal.   Patient has rotten teeth, many rotten and swollen. The right upper maxillary region I think adjacent to #500 #6,  He has a palpable fluctuant abscess that is already starting to drain a few drops of purulence.   Eyes: EOM are normal. Pupils are equal, round, and reactive to light.   Neck: Normal range of motion. Neck supple.   Skin: Skin is warm and dry.    Psychiatric: He has a normal mood and affect. His behavior is normal.   Nursing note and vitals reviewed.        MDM and ED Course     ED Medication Orders     None             MDM          Incision/Drainage  Date/Time: 06/22/2015 11:53 AM  Performed by: Yevette Edwards  Authorized by: Yevette Edwards    Consent:     Consent obtained:  Verbal    Consent given by:  Patient  Comments:      I anesthetized the gum with 1% lidocaine solution with epinephrine. I then made a small stab incision with a #11 blade and squeezed out a moderate amount of purulence, perhaps 3 mL.      Clinical Impression & Disposition     Clinical Impression  Final diagnoses:   Dental abscess  ED Disposition     Discharge Patrici Ranks discharge to home/self care.    Condition at disposition: Stable             New Prescriptions    CLINDAMYCIN (CLEOCIN) 300 MG CAPSULE    Take 1 capsule (300 mg total) by mouth 3 (three) times daily.    HYDROCODONE-ACETAMINOPHEN 5-300 MG TAB    Take 1 tablet by mouth every 6 (six) hours as needed (severe pain).                   Yevette Edwards, MD  06/22/15 [phone]

## 2016-10-31 ENCOUNTER — Ambulatory Visit (INDEPENDENT_AMBULATORY_CARE_PROVIDER_SITE_OTHER): Payer: BLUE CROSS/BLUE SHIELD | Admitting: Family Medicine

## 2016-10-31 ENCOUNTER — Encounter: Payer: Self-pay | Admitting: Family Medicine

## 2016-10-31 VITALS — BP 140/90 | HR 76 | Temp 97.6°F | Ht 71.5 in | Wt 288.5 lb

## 2016-10-31 DIAGNOSIS — Z23 Encounter for immunization: Secondary | ICD-10-CM | POA: Diagnosis not present

## 2016-10-31 DIAGNOSIS — Z0000 Encounter for general adult medical examination without abnormal findings: Secondary | ICD-10-CM | POA: Diagnosis not present

## 2016-10-31 DIAGNOSIS — I10 Essential (primary) hypertension: Secondary | ICD-10-CM

## 2016-10-31 DIAGNOSIS — Z1211 Encounter for screening for malignant neoplasm of colon: Secondary | ICD-10-CM

## 2016-10-31 LAB — COMPREHENSIVE METABOLIC PANEL
ALBUMIN: 4.5 g/dL (ref 3.5–5.2)
ALT: 49 U/L (ref 0–53)
AST: 26 U/L (ref 0–37)
Alkaline Phosphatase: 49 U/L (ref 39–117)
BUN: 15 mg/dL (ref 6–23)
CALCIUM: 9.5 mg/dL (ref 8.4–10.5)
CO2: 29 mEq/L (ref 19–32)
CREATININE: 1.22 mg/dL (ref 0.40–1.50)
Chloride: 106 mEq/L (ref 96–112)
GFR: 65.6 mL/min (ref >60.00)
Glucose, Bld: 124 mg/dL — ABNORMAL HIGH (ref 70–99)
POTASSIUM: 4.6 meq/L (ref 3.5–5.1)
Sodium: 140 mEq/L (ref 135–145)
TOTAL PROTEIN: 7.5 g/dL (ref 6.0–8.3)
Total Bilirubin: 0.7 mg/dL (ref 0.2–1.2)

## 2016-10-31 LAB — LIPID PANEL
CHOLESTEROL: 252 mg/dL — AB (ref 0–200)
HDL: 51.3 mg/dL (ref >39.00)
LDL CALC: 175 mg/dL — AB (ref 0–99)
NonHDL: 200.34
TRIGLYCERIDES: 125 mg/dL (ref 0.0–149.0)
Total CHOL/HDL Ratio: 5
VLDL: 25 mg/dL (ref 0.0–40.0)

## 2016-10-31 MED ORDER — ATENOLOL 50 MG PO TABS: 50.0000 mg | ORAL_TABLET | Freq: Every day | ORAL | 3 refills | Status: DC

## 2016-10-31 NOTE — Assessment & Plan Note (Signed)
Tetanus 2009  Flu encouraged, done today at [facility].  PNA not due  Shingles not due  D/w patient ZO:XWRUEAVre:options for colon cancer screening, including IFOB vs. colonoscopy. Risks and benefits of both were discussed and patient voiced understanding. Pt elects WUJ:WJXBfor:IFOB.  Prostate cancer screening and PSA options (with potential risks and benefits of testing vs not testing) were discussed along with recent recs/guidelines. He declined testing PSA at this point.  Diet and exercise d/w pt. He's trying to work on both. He eventually hit 300 lbs and decided to go on a diet.  He has been packing all of his food for work, he cut out cards and soda.  His wife had gastric surgery and has lost weight.   Living will d/w pt. Wife designated if patient were incapacitated. He prev had HCV and HIV screening with prev employment, per patient report.

## 2016-10-31 NOTE — Progress Notes (Signed)
Pre visit review using our clinic review tool, if applicable. No additional management support is needed unless otherwise documented below in the visit note. 

## 2016-10-31 NOTE — Patient Instructions (Signed)
Go to the lab on the way out.  We'll contact you with your lab report. Keep working on your weight.   Recheck in about 6 months.  We'll go from there.  Take care.  Glad to see you.  Thanks for your effort.

## 2016-10-31 NOTE — Assessment & Plan Note (Signed)
Continue work on diet and exercise.  See notes on labs.  Encouraged patient to continue gradual weight loss with diet and exercise.  No change in meds.  He wanted to recheck in about 6 months, this is reasonable.  He'll update me as needed.  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

## 2016-10-31 NOTE — Addendum Note (Signed)
Addended by: Damita LackLORING, Najeh Credit S on: 10/31/2016 09:54 AM   Modules accepted: Orders

## 2016-10-31 NOTE — Progress Notes (Signed)
CPE- See plan.  Routine anticipatory guidance given to patient.  See health maintenance. Tetanus 2009  Flu encouraged, done today at [facility].  PNA not due  Shingles not due  D/w patient GE:[id number]:options for colon cancer screening, including IFOB vs. colonoscopy. Risks and benefits of both were discussed and patient voiced understanding. Pt elects KGM:WNUUfor:IFOB.  Prostate cancer screening and PSA options (with potential risks and benefits of testing vs not testing) were discussed along with recent recs/guidelines. He declined testing PSA at this point.  Diet and exercise d/w pt. He's trying to work on both. He eventually hit 300 lbs and decided to go on a diet.  He has been packing all of his food for work, he cut out cards and soda.  His wife had gastric surgery and has lost weight.   Living will d/w pt. Wife designated if patient were incapacitated. He prev had HCV and HIV screening with prev employment, per patient report.    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  Resolves by the next AM.  O/w he doesn't have sx with driving 2 hours or less.   Short of breath:no Labs pending.   PMH and SH reviewed  Meds, vitals, and allergies reviewed.   ROS: Per HPI.  Unless specifically indicated otherwise in HPI, the patient denies:  General: fever. Eyes: acute vision changes ENT: sore throat Cardiovascular: chest pain Respiratory: SOB GI: vomiting GU: dysuria Musculoskeletal: acute back pain Derm: acute rash Neuro: acute motor dysfunction Psych: worsening mood Endocrine: polydipsia Heme: bleeding Allergy: hayfever  GEN: nad, alert and oriented HEENT: mucous membranes moist NECK: supple w/o LA CV: rrr. PULM: ctab, no inc wob ABD: soft, +bs EXT: no edema SKIN: no acute rash

## 2016-11-03 ENCOUNTER — Other Ambulatory Visit: Payer: Self-pay | Admitting: Family Medicine

## 2016-11-03 DIAGNOSIS — R739 Hyperglycemia, unspecified: Secondary | ICD-10-CM | POA: Insufficient documentation

## 2017-02-06 ENCOUNTER — Other Ambulatory Visit (INDEPENDENT_AMBULATORY_CARE_PROVIDER_SITE_OTHER): Payer: BLUE CROSS/BLUE SHIELD

## 2017-02-06 DIAGNOSIS — R739 Hyperglycemia, unspecified: Secondary | ICD-10-CM

## 2017-02-06 LAB — HEMOGLOBIN A1C: HEMOGLOBIN A1C: 5.9 % (ref 4.6–6.5)

## 2017-02-06 NOTE — Addendum Note (Signed)
Addended by: Roena MaladyEVONTENNO, Anis Degidio Y on: 02/06/2017 08:59 AM   Modules accepted: Orders

## 2017-04-04 ENCOUNTER — Telehealth: Payer: Self-pay

## 2017-04-04 NOTE — Telephone Encounter (Signed)
If he had just eaten that may have elevated it but the 250 is atypical.  Unclear if their meter read falsely high.  Fasting sugar of 90 is normal.  Would have him check fasting sugars for a few days, each AM.  If >100, then update me.  Low carb diet in the meantime.  Thanks.

## 2017-04-04 NOTE — Telephone Encounter (Signed)
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 When pt got home later that day BS was 90. About 30 mins after dinner last night BS was 163. 02/06/17 A1C was normal. Pt is concerned about sugar beng elevated. last annual on 10/31/16 and pt has 6 mth f/u 05/01/17. Pt request cb with what needs to be done.

## 2017-04-04 NOTE — Telephone Encounter (Signed)
Patient advised.

## 2017-05-01 ENCOUNTER — Ambulatory Visit: Payer: BLUE CROSS/BLUE SHIELD | Admitting: Family Medicine

## 2017-05-08 ENCOUNTER — Ambulatory Visit (INDEPENDENT_AMBULATORY_CARE_PROVIDER_SITE_OTHER): Payer: BLUE CROSS/BLUE SHIELD | Admitting: Family Medicine

## 2017-05-08 ENCOUNTER — Encounter: Payer: Self-pay | Admitting: Family Medicine

## 2017-05-08 VITALS — BP 142/80 | HR 63 | Temp 97.6°F | Wt 294.2 lb

## 2017-05-08 DIAGNOSIS — R739 Hyperglycemia, unspecified: Secondary | ICD-10-CM

## 2017-05-08 DIAGNOSIS — I10 Essential (primary) hypertension: Secondary | ICD-10-CM | POA: Diagnosis not present

## 2017-05-08 LAB — HEMOGLOBIN A1C: Hgb A1c MFr Bld: 6 % (ref 4.6–6.5)

## 2017-05-08 NOTE — Patient Instructions (Addendum)
Shirlee LimerickMarion will call about your referral- make sure that your insurance will cover the nutrition consult. Ask about a 30min appointment for me to work on the cyst on your forehead.  Go to the lab on the way out.  We'll contact you with your lab report. Don't change your meds for now.  Take care.  Glad to see you.

## 2017-05-08 NOTE — Assessment & Plan Note (Signed)
He is having more trouble with timing and amount of foods, not just the food choices itself.  His work schedule keeps him from eating supper until late at night.  Diet discussed.   His weight is the main issue.   Weight is up on his home scales, with some fluctuation.   Refer to nutrition.  No change in meds today.

## 2017-05-08 NOTE — Progress Notes (Signed)
Hyperglycemia. His sugar has been ~100 on home checks, occ higher. No sig elevations.   Due for f/u A1c.    Hypertension:    Using medication without problems or lightheadedness: yes Chest pain with exertion:no Edema:not unless he is driving back and forth to [place], ie long route.   Short of breath:no He is having more trouble with timing and amount of foods, not just the food choices itself.  His work schedule keeps him from eating supper until late at night.  Diet discussed.   His weight is the main issue.   Weight is up on his home scales, with some fluctuation.    Meds, vitals, and allergies reviewed.   PMH and SH reviewed  ROS: Per HPI unless specifically indicated in ROS section   GEN: nad, alert and oriented HEENT: mucous membranes moist NECK: supple w/o LA CV: rrr. PULM: ctab, no inc wob ABD: soft, +bs EXT: no edema SKIN: no acute rash Cyst on forehead has prev drained but returned (see AVS).

## 2017-05-08 NOTE — Assessment & Plan Note (Signed)
Diet discussed, see notes on repeat A1c.

## 2017-05-15 ENCOUNTER — Encounter: Payer: Self-pay | Admitting: Family Medicine

## 2017-05-15 ENCOUNTER — Ambulatory Visit (INDEPENDENT_AMBULATORY_CARE_PROVIDER_SITE_OTHER): Payer: BLUE CROSS/BLUE SHIELD | Admitting: Family Medicine

## 2017-05-15 DIAGNOSIS — L989 Disorder of the skin and subcutaneous tissue, unspecified: Secondary | ICD-10-CM

## 2017-05-15 NOTE — Patient Instructions (Signed)
Pull the packing tomorrow and keep the area clean and covered.  Wash with soap and water.  Take care.  Glad to see you.

## 2017-05-16 DIAGNOSIS — L989 Disorder of the skin and subcutaneous tissue, unspecified: Secondary | ICD-10-CM | POA: Insufficient documentation

## 2017-05-16 NOTE — Assessment & Plan Note (Signed)
Tolerated well.  Routine postprocedure instructions d/w pt- remove packing in 24h, keep area clean and bandaged, follow up if concerns/spreading erythema/pain.  I had every reason to believe that this was a benign cystic lesion. The skin over top of the lesion was smooth without ulceration and there was no concern for a cancerous process. Upon incision, no fluid or material could be expressed. The area was directly examined. On gross examination it appeared that he had only scar tissue without a fluid-filled cavity. Discussed with patient mid-procedure. This was an unforeseen issue but not a complication per se. We packed the wound has we normally would with a small amount of iodoform gauze. He will still remove that in about 1 day. It is likely best for him to allow the incision to heal up. We can refer to dermatology for full elliptical excision in the future if needed. Discussed with patient. He understood. I have drained many, many similar lesions in the past 15 years. I can only think of one other time in the past 15 years where there was a similar situation where no cystic material could be expressed. He understood.

## 2017-05-16 NOTE — Progress Notes (Signed)
I&D  Meds, vitals, and allergies reviewed.   Indication: suspect irritated cyst.  This lesion has previously drained, it has reaccumulated material in the meantime.  Pt complaints of: irritation, swelling  Location: L forehead  Size: ~1cm  Informed consent obtained.  Pt aware of risks not limited to but including infection, bleeding, damage to near by organs.  Prep: etoh/betadine  Anesthesia: 1%lidocaine with epi, good effect  Incision made with #11 blade  Would explored.   Wound packed with iodoform gauze

## 2017-11-20 DIAGNOSIS — I10 Essential (primary) hypertension: Secondary | ICD-10-CM | POA: Diagnosis not present

## 2017-12-14 DIAGNOSIS — L720 Epidermal cyst: Secondary | ICD-10-CM | POA: Diagnosis not present

## 2017-12-14 DIAGNOSIS — I872 Venous insufficiency (chronic) (peripheral): Secondary | ICD-10-CM | POA: Diagnosis not present

## 2018-04-27 DIAGNOSIS — L723 Sebaceous cyst: Secondary | ICD-10-CM | POA: Diagnosis not present

## 2018-06-29 DIAGNOSIS — L989 Disorder of the skin and subcutaneous tissue, unspecified: Secondary | ICD-10-CM | POA: Diagnosis not present

## 2018-06-29 DIAGNOSIS — L723 Sebaceous cyst: Secondary | ICD-10-CM | POA: Diagnosis not present

## 2019-07-18 ENCOUNTER — Emergency Department (HOSPITAL_COMMUNITY): Payer: 59

## 2019-07-18 ENCOUNTER — Other Ambulatory Visit: Payer: Self-pay

## 2019-07-18 ENCOUNTER — Emergency Department (HOSPITAL_COMMUNITY)
Admission: EM | Admit: 2019-07-18 | Discharge: 2019-07-18 | Disposition: A | Discharge status: Home / Self Care | Payer: 59 | Attending: Emergency Medicine | Admitting: Emergency Medicine

## 2019-07-18 ENCOUNTER — Encounter (HOSPITAL_COMMUNITY): Payer: Self-pay | Admitting: Emergency Medicine

## 2019-07-18 DIAGNOSIS — R0781 Pleurodynia: Secondary | ICD-10-CM | POA: Insufficient documentation

## 2019-07-18 DIAGNOSIS — R0789 Other chest pain: Secondary | ICD-10-CM

## 2019-07-18 DIAGNOSIS — I10 Essential (primary) hypertension: Secondary | ICD-10-CM | POA: Insufficient documentation

## 2019-07-18 MED ORDER — IBUPROFEN 800 MG PO TABS
800.0000 mg | ORAL_TABLET | Freq: Once | ORAL | Status: AC
  Administered 2019-07-18: 800 mg via ORAL
  Filled 2019-07-18: qty 1
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
## 2019-07-18 NOTE — Discharge Instructions (Addendum)
Take it easy, but do not lay around too much as this may make any stiffness worse.  Antiinflammatory medications: Take 600 mg of ibuprofen every 6 hours or 440 mg (over the counter dose) to 500 mg (prescription dose) of naproxen every 12 hours for the next 3 days. After this time, these medications may be used as needed for pain. Take these medications with food to avoid upset stomach. Choose only one of these medications, do not take them together. Acetaminophen (generic for Tylenol): Should you continue to have additional pain while taking the ibuprofen or naproxen, you may add in acetaminophen as needed. Your daily total maximum amount of acetaminophen from all sources should be limited to 4000mg /day for persons without liver problems, or 2000mg /day for those with liver problems. Diclofenac gel: This is a topical anti-inflammatory medication and can be applied directly to the painful region.  Do not use on the face or genitals.  This medication may be used as an alternative to oral anti-inflammatory medications, such as ibuprofen or naproxen. Lidocaine patches: These are available via either prescription or over-the-counter. The over-the-counter option may be more economical one and are likely just as effective. There are multiple over-the-counter brands, such as Salonpas. Exercises: Be sure to perform the attached exercises starting with three times a week and working up to performing them daily. This is an essential part of preventing long term problems.  Follow up: Follow up with a primary care provider for any future management of these complaints. Be sure to follow up within 7-10 days. Return: Return to the ED should symptoms worsen.  For prescription assistance, may try using prescription discount sites or apps, such as goodrx.com  Your blood pressure was higher than normal today.  This will need to be followed up upon by a primary care provider.  Call to make an appointment.
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
## 2019-07-18 NOTE — ED Notes (Signed)
Pt watching presidential debate  Pt's wife telling him he needs to be admitted, pt yelling at wife telling her to not talk to him about his blood pressure.

## 2019-08-12 ENCOUNTER — Telehealth: Payer: Self-pay | Admitting: Family Medicine

## 2019-08-12 NOTE — Telephone Encounter (Signed)
Pt is asleep until 9 pm tonioght pt works 3rd shift. Pt has had dry cough for 6-7 months. No covid symptoms,no travel,no known exposure to covid. pts wife said he is sure pt does not have covid. Pt does not have BP cuff to ck BP. Pt s wife said pt needs to be seen in office.Please advise.pt has appt on Friday 11/20 at 9:45. This is first available appt that pt could come in for. Pt does not have CP. Mrs Flamenco request cb after Dr Damita Dunnings reviews to see if pt can come in office for appt.

## 2019-08-12 NOTE — Telephone Encounter (Signed)
Please triage this patient. See below.  Thanks.

## 2019-08-12 NOTE — Telephone Encounter (Signed)
Lattie Haw (spouse) called to make pt appointment.  Schedule for Friday 11/20 @ 9:45  When doing covid screening she stated pt has cough.  I explained to her that cannot bring anyone with cough. She stated pt has had this for several months  Ok to bring in office??  She also wanted to let you know Pt went to ER on 10/22.  The xray show arotic atherosclerosis. And pt had very High bp at that time.  She stated pt is withdrawn/drepessed never feels good.

## 2019-08-13 NOTE — Telephone Encounter (Signed)
Patient advised.

## 2019-08-13 NOTE — Telephone Encounter (Signed)
If this is a chronic cough that he has had for months and it is unchanged, then I think it makes sense to come in for the visit.  If his symptoms change in the meantime we may have to make other arrangements.  Thanks.

## 2019-08-16 ENCOUNTER — Ambulatory Visit (INDEPENDENT_AMBULATORY_CARE_PROVIDER_SITE_OTHER): Payer: 59 | Admitting: Family Medicine

## 2019-08-16 ENCOUNTER — Encounter: Payer: Self-pay | Admitting: Family Medicine

## 2019-08-16 ENCOUNTER — Other Ambulatory Visit: Payer: Self-pay

## 2019-08-16 VITALS — BP 138/88 | HR 75 | Temp 97.6°F | Ht 72.0 in | Wt 296.6 lb

## 2019-08-16 DIAGNOSIS — Z23 Encounter for immunization: Secondary | ICD-10-CM | POA: Diagnosis not present

## 2019-08-16 DIAGNOSIS — R5383 Other fatigue: Secondary | ICD-10-CM

## 2019-08-16 DIAGNOSIS — I700 Atherosclerosis of aorta: Secondary | ICD-10-CM | POA: Diagnosis not present

## 2019-08-16 DIAGNOSIS — Z659 Problem related to unspecified psychosocial circumstances: Secondary | ICD-10-CM | POA: Diagnosis not present

## 2019-08-16 DIAGNOSIS — R0683 Snoring: Secondary | ICD-10-CM | POA: Diagnosis not present

## 2019-08-16 LAB — COMPREHENSIVE METABOLIC PANEL
ALT: 58 U/L — ABNORMAL HIGH (ref 0–53)
AST: 25 U/L (ref 0–37)
Albumin: 4.6 g/dL (ref 3.5–5.2)
Alkaline Phosphatase: 54 U/L (ref 39–117)
BUN: 16 mg/dL (ref 6–23)
CO2: 27 mEq/L (ref 19–32)
Calcium: 9.4 mg/dL (ref 8.4–10.5)
Chloride: 103 mEq/L (ref 96–112)
Creatinine, Ser: 1.21 mg/dL (ref 0.40–1.50)
GFR: 61.68 mL/min (ref >60.00)
Glucose, Bld: 122 mg/dL — ABNORMAL HIGH (ref 70–99)
Potassium: 4.5 mEq/L (ref 3.5–5.1)
Sodium: 139 mEq/L (ref 135–145)
Total Bilirubin: 0.9 mg/dL (ref 0.2–1.2)
Total Protein: 7.3 g/dL (ref 6.0–8.3)

## 2019-08-16 LAB — CBC WITH DIFFERENTIAL/PLATELET
Basophils Absolute: 0.1 10*3/uL (ref 0.0–0.1)
Basophils Relative: 1.3 % (ref 0.0–3.0)
Eosinophils Absolute: 0.5 10*3/uL (ref 0.0–0.7)
Eosinophils Relative: 9.1 % — ABNORMAL HIGH (ref 0.0–5.0)
HCT: 46.5 % (ref 39.0–52.0)
Hemoglobin: 15.8 g/dL (ref 13.0–17.0)
Lymphocytes Relative: 30.6 % (ref 12.0–46.0)
Lymphs Abs: 1.5 10*3/uL (ref 0.7–4.0)
MCHC: 34 g/dL (ref 30.0–36.0)
MCV: 85.5 fl (ref 78.0–100.0)
Monocytes Absolute: 0.3 10*3/uL (ref 0.1–1.0)
Monocytes Relative: 6.1 % (ref 3.0–12.0)
Neutro Abs: 2.6 10*3/uL (ref 1.4–7.7)
Neutrophils Relative %: 52.9 % (ref 43.0–77.0)
Platelets: 180 10*3/uL (ref 150.0–400.0)
RBC: 5.44 Mil/uL (ref 4.22–5.81)
RDW: 12.8 % (ref 11.5–15.5)
WBC: 5 10*3/uL (ref 4.0–10.5)

## 2019-08-16 LAB — LIPID PANEL
Cholesterol: 236 mg/dL — ABNORMAL HIGH (ref 0–200)
HDL: 46.2 mg/dL (ref >39.00)
LDL Cholesterol: 160 mg/dL — ABNORMAL HIGH (ref 0–99)
NonHDL: 189.82
Total CHOL/HDL Ratio: 5
Triglycerides: 147 mg/dL (ref 0.0–149.0)
VLDL: 29.4 mg/dL (ref 0.0–40.0)

## 2019-08-16 LAB — HEMOGLOBIN A1C: Hgb A1c MFr Bld: 6.4 % (ref 4.6–6.5)

## 2019-08-16 LAB — TSH: TSH: 2.44 u[IU]/mL (ref 0.35–4.50)
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
## 2019-08-16 NOTE — Patient Instructions (Signed)
Flu shot today.  Thanks for getting that done.  Wear a mask, wash your hands.   Go to the lab on the way out.  We'll contact you with your lab report. We'll call about a pulmonary appointment for possible OSA testing.  Take care.  Glad to see you.

## 2019-08-18 ENCOUNTER — Encounter: Payer: Self-pay | Admitting: Family Medicine

## 2019-08-18 ENCOUNTER — Other Ambulatory Visit: Payer: Self-pay | Admitting: Family Medicine

## 2019-08-18 DIAGNOSIS — R739 Hyperglycemia, unspecified: Secondary | ICD-10-CM

## 2019-08-18 DIAGNOSIS — E785 Hyperlipidemia, unspecified: Secondary | ICD-10-CM

## 2019-08-18 DIAGNOSIS — R0683 Snoring: Secondary | ICD-10-CM | POA: Insufficient documentation

## 2019-08-18 DIAGNOSIS — Z659 Problem related to unspecified psychosocial circumstances: Secondary | ICD-10-CM | POA: Insufficient documentation

## 2019-08-18 DIAGNOSIS — I700 Atherosclerosis of aorta: Secondary | ICD-10-CM | POA: Insufficient documentation

## 2019-08-18 MED ORDER — ATORVASTATIN CALCIUM 20 MG PO TABS: 20.0000 mg | ORAL_TABLET | Freq: Every day | ORAL | 3 refills | Status: DC

## 2019-08-18 NOTE — Assessment & Plan Note (Signed)
Likely OSA d/w pt. Snoring "horribly" per wife.  She noted him waking gasping for air.  Fatigue noted.   OSA pathophysiology discussed with patient.  Refer for testing through pulmonary.>25 minutes spent in face to face time with patient, >50% spent in counselling or coordination of care
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
## 2019-08-18 NOTE — Assessment & Plan Note (Signed)
Aortic atherosclerosis d/w pt.  Recheck labs pending and statin indication d/w pt. see notes on labs.

## 2019-09-09 ENCOUNTER — Institutional Professional Consult (permissible substitution): Payer: 59 | Admitting: Pulmonary Disease

## 2019-09-13 ENCOUNTER — Institutional Professional Consult (permissible substitution): Payer: 59 | Admitting: Pulmonary Disease

## 2019-11-12 ENCOUNTER — Other Ambulatory Visit: Payer: Self-pay

## 2019-11-12 ENCOUNTER — Ambulatory Visit: Payer: 59 | Attending: Internal Medicine

## 2019-11-12 ENCOUNTER — Ambulatory Visit
Admission: EM | Admit: 2019-11-12 | Discharge: 2019-11-12 | Disposition: A | Discharge status: Home / Self Care | Payer: 59 | Attending: Emergency Medicine | Admitting: Emergency Medicine

## 2019-11-12 DIAGNOSIS — K529 Noninfective gastroenteritis and colitis, unspecified: Secondary | ICD-10-CM

## 2019-11-12 DIAGNOSIS — Z20822 Contact with and (suspected) exposure to covid-19: Secondary | ICD-10-CM

## 2019-11-12 MED ORDER — ONDANSETRON HCL 4 MG PO TABS: 4.0000 mg | ORAL_TABLET | Freq: Four times a day (QID) | ORAL | 0 refills | Status: DC

## 2019-11-12 NOTE — ED Provider Notes (Signed)
[facility]   [id number] 11/12/19 Arrival Time: 3267   CC: fatigue, diarrhea, nausea  SUBJECTIVE: History from: patient.  Riley Howard is a 59 y.o. male who presents with fatigue, nausea, and 5-6 episodes of watery diarrhea daily x 3 day.  Denies sick exposure to COVID, flu or strep.  Denies recent travel or antibiotic use.  Symptoms began after eating at the sanitary cafe.  However, daughter who also ate there has not experienced symptoms.  Has tried pushing fluids without relief.  Symptoms are made worse with attempting to eat.  Reports previous symptoms in the past with a GI bug.   Denies fever, chills, sinus pain, rhinorrhea, sore throat, cough, SOB, wheezing, chest pain, abdominal pain, vomiting, changes in bladder habits.    ROS: As per HPI.  All other pertinent ROS negative.     Past Medical History:  Diagnosis Date  . Hyperlipidemia   . Hypertension   . Pneumonia in child    History reviewed. No pertinent surgical history. No Known Allergies No current facility-administered medications on file prior to encounter.   Current Outpatient Medications on File Prior to Encounter  Medication Sig Dispense Refill  . atorvastatin (LIPITOR) 20 MG tablet Take 1 tablet (20 mg total) by mouth daily. 90 tablet 3   Social History   Socioeconomic History  . Marital status: Married    Spouse name: Not on file  . Number of children: 0  . Years of education: Not on file  . Highest education level: Not on file  Occupational History  . Occupation: CNA, former Administrator, Presenter, broadcasting 2010  Tobacco Use  . Smoking status: Never Smoker  . Smokeless tobacco: Never Used  Substance and Sexual Activity  . Alcohol use: No  . Drug use: No  . Sexual activity: Not on file  Other Topics Concern  . Not on file  Social History Narrative   Married 1993, lives with wife   2 adopted children, son (who has mental illness) and daughter   From [place], local  routes as of 2020, no overnight routes   Social Determinants of Radio broadcast assistant Strain:   . Difficulty of Paying Living Expenses: Not on file  Food Insecurity:   . Worried About Charity fundraiser in the Last Year: Not on file  . Ran Out of Food in the Last Year: Not on file  Transportation Needs:   . Lack of Transportation (Medical): Not on file  . Lack of Transportation (Non-Medical): Not on file  Physical Activity:   . Days of Exercise per Week: Not on file  . Minutes of Exercise per Session: Not on file  Stress:   . Feeling of Stress : Not on file  Social Connections:   . Frequency of Communication with Friends and Family: Not on file  . Frequency of Social Gatherings with Friends and Family: Not on file  . Attends Religious Services: Not on file  . Active Member of Clubs or Organizations: Not on file  . Attends Archivist Meetings: Not on file  . Marital Status: Not on file  Intimate Partner Violence:   . Fear of Current or Ex-Partner: Not on file  . Emotionally Abused: Not on file  . Physically Abused: Not on file  . Sexually Abused: Not on file   Family History  Problem Relation Age of Onset  . Diabetes Mother   . Dementia Mother  Organic Brain Syndrome  . Bipolar disorder Mother   . Schizophrenia Mother   . Heart disease Mother        MI  . Hypertension Mother   . Depression Mother   . CAD Father   . Cancer Sister        Unknown type  . Obesity Sister   . Hypertension Brother   . COPD Brother   . Bipolar disorder Brother   . Hyperlipidemia Brother   . Hypertension Brother   . Hyperlipidemia Brother   . COPD Brother   . Diabetes Brother   . Alcohol abuse Neg Hx   . Drug abuse Neg Hx   . Stroke Neg Hx   . Colon cancer Neg Hx   . Prostate cancer Neg Hx     OBJECTIVE:  Vitals:   11/12/19 1252  BP: (!) 154/95  Pulse: (!) 102  Resp: 20  Temp: 98.3 F (36.8 C)  SpO2: 95%     General appearance: alert; appears mildly  fatigued, but nontoxic; speaking in full sentences and tolerating own secretions HEENT: NCAT; Ears: EACs clear, TMs pearly gray; Eyes: PERRL.  EOM grossly intact. Nose: nares patent without rhinorrhea, Throat: oropharynx clear, tonsils non erythematous or enlarged, uvula midline  Neck: supple without LAD Lungs: unlabored respirations, symmetrical air entry; cough: absent; no respiratory distress; CTAB Heart: regular rate and rhythm.   Abdomen: soft, nondistended, normal active bowel sounds; nontender to palpation; no guarding  Skin: warm and dry Psychological: alert and cooperative; normal mood and affect  ASSESSMENT & PLAN:  1. Suspected COVID-19 virus infection   2. Gastroenteritis     Meds ordered this encounter  Medications  . ondansetron (ZOFRAN) 4 MG tablet    Sig: Take 1 tablet (4 mg total) by mouth every 6 (six) hours.    Dispense:  20 tablet    Refill:  0    Order Specific Question:   Supervising Provider    Answer:   Eustace Moore [[id number]]    COVID test pending In the meantime: You should remain isolated in your home for 10 days from symptom onset AND greater than 72 hours after symptoms resolution (absence of fever without the use of fever-reducing medication and improvement in respiratory symptoms), whichever is longer Get plenty of rest and push fluids You may supplement with OTC pedialyte or oral rehydration solution Zofran prescribed. Take as needed for nausea and/or vomiting Avoid fatty, greasy food.  Stick with bland foods like dry toast, banana, apple sauce, dry crackers, etc... Call or go to the ED if you have any new or worsening symptoms such as fever, worsening cough, shortness of breath, chest tightness, chest pain, turning blue, changes in mental status, persistent diarrhea, black tarry stools, blood in stool, abdominal pain, etc...   Reviewed expectations re: course of current medical issues. Questions answered. Outlined signs and symptoms indicating  need for more acute intervention. Patient verbalized understanding. After Visit Summary given.         Rennis Harding, PA-C 11/12/19 1333

## 2019-11-12 NOTE — ED Triage Notes (Signed)
Pt presents with c/o watery diarrhea and fatigue for past 2 days, pt took covid test yesterday

## 2019-11-12 NOTE — Discharge Instructions (Signed)
COVID test pending In the meantime: You should remain isolated in your home for 10 days from symptom onset AND greater than 72 hours after symptoms resolution (absence of fever without the use of fever-reducing medication and improvement in respiratory symptoms), whichever is longer Get plenty of rest and push fluids You may supplement with OTC pedialyte or oral rehydration solution Zofran prescribed. Take as needed for nausea and/or vomiting Avoid fatty, greasy food.  Stick with bland foods like dry toast, banana, apple sauce, dry crackers, etc... Call or go to the ED if you have any new or worsening symptoms such as fever, worsening cough, shortness of breath, chest tightness, chest pain, turning blue, changes in mental status, persistent diarrhea, black tarry stools, blood in stool, abdominal pain, etc..Marland Kitchen

## 2019-11-14 ENCOUNTER — Encounter: Payer: Self-pay | Admitting: Physician Assistant

## 2019-11-14 ENCOUNTER — Ambulatory Visit (HOSPITAL_COMMUNITY)
Admission: RE | Admit: 2019-11-14 | Discharge: 2019-11-14 | Disposition: A | Discharge status: Home / Self Care | Payer: 59 | Source: Ambulatory Visit | Attending: Pulmonary Disease | Admitting: Pulmonary Disease

## 2019-11-14 ENCOUNTER — Other Ambulatory Visit: Payer: Self-pay | Admitting: Physician Assistant

## 2019-11-14 DIAGNOSIS — U071 COVID-19: Secondary | ICD-10-CM

## 2019-11-14 DIAGNOSIS — I10 Essential (primary) hypertension: Secondary | ICD-10-CM

## 2019-11-14 DIAGNOSIS — E785 Hyperlipidemia, unspecified: Secondary | ICD-10-CM | POA: Insufficient documentation

## 2019-11-14 DIAGNOSIS — J189 Pneumonia, unspecified organism: Secondary | ICD-10-CM | POA: Insufficient documentation

## 2019-11-14 LAB — NOVEL CORONAVIRUS, NAA: SARS-CoV-2, NAA: DETECTED — AB

## 2019-11-14 MED ORDER — ALBUTEROL SULFATE HFA 108 (90 BASE) MCG/ACT IN AERS: 2.0000 | INHALATION_SPRAY | Freq: Once | RESPIRATORY_TRACT | Status: DC | PRN

## 2019-11-14 MED ORDER — SODIUM CHLORIDE 0.9 % IV SOLN
INTRAVENOUS | Status: DC | PRN
  Administered 2019-11-14: 250 mL via INTRAVENOUS

## 2019-11-14 MED ORDER — SODIUM CHLORIDE 0.9 % IV BOLUS
250.0000 mL | Freq: Once | INTRAVENOUS | Status: AC
  Administered 2019-11-14: 250 mL via INTRAVENOUS

## 2019-11-14 MED ORDER — SODIUM CHLORIDE 0.9 % IV SOLN
700.0000 mg | Freq: Once | INTRAVENOUS | Status: AC
  Administered 2019-11-14: 11:00:00 700 mg via INTRAVENOUS
  Filled 2019-11-14: qty 20

## 2019-11-14 MED ORDER — EPINEPHRINE 0.3 MG/0.3ML IJ SOAJ: 0.3000 mg | Freq: Once | INTRAMUSCULAR | Status: DC | PRN

## 2019-11-14 MED ORDER — METHYLPREDNISOLONE SODIUM SUCC 125 MG IJ SOLR: 125.0000 mg | Freq: Once | INTRAMUSCULAR | Status: DC | PRN

## 2019-11-14 MED ORDER — DIPHENHYDRAMINE HCL 50 MG/ML IJ SOLN: 50.0000 mg | Freq: Once | INTRAMUSCULAR | Status: DC | PRN

## 2019-11-14 MED ORDER — FAMOTIDINE IN NACL 20-0.9 MG/50ML-% IV SOLN: 20.0000 mg | Freq: Once | INTRAVENOUS | Status: DC | PRN

## 2019-11-14 NOTE — Discharge Instructions (Signed)

## 2019-11-14 NOTE — Progress Notes (Addendum)
Notified clinical lead RN's Anitra and Amy of pt high blood pressure. Pt states that he has had diarrhea 4 times per day since Saturday, has had diarrhea today. Nauseated and no appetite. Has not taken his BP meds since Saturday.  Verbal orders obtained and placed for NS.

## 2019-11-14 NOTE — Progress Notes (Signed)
  Diagnosis: COVID-19  Physician: Dr. Delford Field  Procedure: Covid Infusion Clinic Med: bamlanivimab infusion - Provided patient with bamlanimivab fact sheet for patients, parents and caregivers prior to infusion.  Complications: No immediate complications noted.  Discharge: Discharged home   Riley Howard 11/14/2019

## 2019-11-14 NOTE — Progress Notes (Signed)
  I connected by phone with Riley Howard on 11/14/2019 at 8:11 AM to discuss the potential use of an new treatment for mild to moderate COVID-19 viral infection in non-hospitalized patients.  This patient is a 59 y.o. male that meets the FDA criteria for Emergency Use Authorization of bamlanivimab or casirivimab\imdevimab.  Has a (+) direct SARS-CoV-2 viral test result  Has mild or moderate COVID-19   Is ? 58 years of age and weighs ? 40 kg  Is NOT hospitalized due to COVID-19  Is NOT requiring oxygen therapy or requiring an increase in baseline oxygen flow rate due to COVID-19  Is within 10 days of symptom onset  Has at least one of the high risk factor(s) for progression to severe COVID-19 and/or hospitalization as defined in EUA.  Specific high risk criteria : Hypertension   I have spoken and communicated the following to the patient or parent/caregiver:  1. FDA has authorized the emergency use of bamlanivimab and casirivimab\imdevimab for the treatment of mild to moderate COVID-19 in adults and pediatric patients with positive results of direct SARS-CoV-2 viral testing who are 57 years of age and older weighing at least 40 kg, and who are at high risk for progressing to severe COVID-19 and/or hospitalization.  2. The significant known and potential risks and benefits of bamlanivimab and casirivimab\imdevimab, and the extent to which such potential risks and benefits are unknown.  3. Information on available alternative treatments and the risks and benefits of those alternatives, including clinical trials.  4. Patients treated with bamlanivimab and casirivimab\imdevimab should continue to self-isolate and use infection control measures (e.g., wear mask, isolate, social distance, avoid sharing personal items, clean and disinfect "high touch" surfaces, and frequent handwashing) according to CDC guidelines.   5. The patient or parent/caregiver has the option to accept or refuse  bamlanivimab or casirivimab\imdevimab .  After reviewing this information with the patient, The patient agreed to proceed with receiving the bamlanimivab infusion and will be provided a copy of the Fact sheet prior to receiving the infusion.   Sx onset 2/13. Set up for infusion TODAY 11/14/19 @ 2:30pm. Directions given and MyChart message sent. Cline Crock 11/14/2019 8:11 AM

## 2019-11-21 ENCOUNTER — Ambulatory Visit (INDEPENDENT_AMBULATORY_CARE_PROVIDER_SITE_OTHER): Payer: 59 | Admitting: Family Medicine

## 2019-11-21 ENCOUNTER — Other Ambulatory Visit: Payer: Self-pay

## 2019-11-21 ENCOUNTER — Encounter: Payer: Self-pay | Admitting: Family Medicine

## 2019-11-21 DIAGNOSIS — R0683 Snoring: Secondary | ICD-10-CM | POA: Diagnosis not present

## 2019-11-21 DIAGNOSIS — U071 COVID-19: Secondary | ICD-10-CM

## 2019-11-21 NOTE — Progress Notes (Signed)
Interactive audio and video telecommunications were attempted between this provider and patient, however failed, due to patient having technical difficulties OR patient did not have access to video capability.  We continued and completed visit with audio only.   Virtual Visit via Telephone Note  I connected with patient on 11/21/19  at 12:00 PM  by telephone and verified that I am speaking with the correct person using two identifiers.  Location of patient:  Home  Location of MD: [facility] Name of referring provider (if blank then none associated): Names per persons and role in encounter:  MD: Ferd Hibbs, Patient: name listed above.    I discussed the limitations, risks, security and privacy concerns of performing an evaluation and management service by telephone and the availability of in person appointments. I also discussed with the patient that there may be a patient responsible charge related to this service. The patient expressed understanding and agreed to proceed.  CC: covid positive.   HPI:  Tested positive 11/12/19, initially with sx on 11/09/19.  He is quarantining in the meantime. He had nausea initially, felt shaky and chills, got tested and was positive.  He had infusion on 11/14/19.    He has less diarrhea, better in the last 2 days.  Eating better.  No fevers now.  No nausea now.  He "hasn't gotten my strength back yet" but is clearly better.  He has isolated from family.  Not SOB, no cough.  He clearly feels better.     Observations/Objective: nad Speech wnl  Assessment and Plan: Recent Covid positive, feeling better in the meantime.  Status post infusion.  He has isolated from family in the meantime.  Not short of breath.  Still okay for outpatient follow-up.  Discussed routine timeline for return to work and work note done.  He will update me as needed.  We talked about previous pulmonary referral and I gave him the phone number to call about setting  follow-up.  We talked about getting follow-up labs done.  Labs are ordered.  Follow Up Instructions: See above.  I discussed the assessment and treatment plan with the patient. The patient was provided an opportunity to ask questions and all were answered. The patient agreed with the plan and demonstrated an understanding of the instructions.   The patient was advised to call back or seek an in-person evaluation if the symptoms worsen or if the condition fails to improve as anticipated.  I provided 15 minutes of non-face-to-face time during this encounter.  Crawford Givens, MD

## 2019-11-22 ENCOUNTER — Other Ambulatory Visit: Payer: 59

## 2019-11-22 NOTE — Telephone Encounter (Signed)
Patient called and said he didn't receive the return to work note for today.  Patient wants to know if note can be put in my chart or call patient back when note is ready.  Patient's phone number [phone].

## 2019-11-22 NOTE — Telephone Encounter (Signed)
Thanks for giving the letter to the patient.

## 2019-11-24 DIAGNOSIS — U071 COVID-19: Secondary | ICD-10-CM | POA: Insufficient documentation

## 2019-11-24 NOTE — Assessment & Plan Note (Signed)
We talked about previous pulmonary referral and I gave him the phone number to call about setting follow-up.

## 2019-11-24 NOTE — Assessment & Plan Note (Signed)
Recent Covid positive, feeling better in the meantime.  Status post infusion.  He has isolated from family in the meantime.  Not short of breath.  Still okay for outpatient follow-up.  Discussed routine timeline for return to work and work note done.  He will update me as needed.

## 2019-11-29 ENCOUNTER — Other Ambulatory Visit: Payer: Self-pay

## 2019-11-29 ENCOUNTER — Other Ambulatory Visit (INDEPENDENT_AMBULATORY_CARE_PROVIDER_SITE_OTHER): Payer: 59

## 2019-11-29 DIAGNOSIS — R739 Hyperglycemia, unspecified: Secondary | ICD-10-CM

## 2019-11-29 DIAGNOSIS — E785 Hyperlipidemia, unspecified: Secondary | ICD-10-CM | POA: Diagnosis not present

## 2019-11-29 LAB — LIPID PANEL
Cholesterol: 201 mg/dL — ABNORMAL HIGH (ref 0–200)
HDL: 40.4 mg/dL (ref >39.00)
LDL Cholesterol: 132 mg/dL — ABNORMAL HIGH (ref 0–99)
NonHDL: 160.68
Total CHOL/HDL Ratio: 5
Triglycerides: 144 mg/dL (ref 0.0–149.0)
VLDL: 28.8 mg/dL (ref 0.0–40.0)

## 2019-11-29 LAB — COMPREHENSIVE METABOLIC PANEL
ALT: 48 U/L (ref 0–53)
AST: 23 U/L (ref 0–37)
Albumin: 3.9 g/dL (ref 3.5–5.2)
Alkaline Phosphatase: 57 U/L (ref 39–117)
BUN: 15 mg/dL (ref 6–23)
CO2: 28 mEq/L (ref 19–32)
Calcium: 9.3 mg/dL (ref 8.4–10.5)
Chloride: 105 mEq/L (ref 96–112)
Creatinine, Ser: 1.17 mg/dL (ref 0.40–1.50)
GFR: 64.06 mL/min (ref >60.00)
Glucose, Bld: 121 mg/dL — ABNORMAL HIGH (ref 70–99)
Potassium: 4.2 mEq/L (ref 3.5–5.1)
Sodium: 140 mEq/L (ref 135–145)
Total Bilirubin: 0.8 mg/dL (ref 0.2–1.2)
Total Protein: 6.9 g/dL (ref 6.0–8.3)

## 2019-11-29 LAB — HEMOGLOBIN A1C: Hgb A1c MFr Bld: 6.5 % (ref 4.6–6.5)

## 2019-12-01 ENCOUNTER — Encounter: Payer: Self-pay | Admitting: Family Medicine

## 2019-12-01 DIAGNOSIS — Z8639 Personal history of other endocrine, nutritional and metabolic disease: Secondary | ICD-10-CM | POA: Insufficient documentation

## 2019-12-01 DIAGNOSIS — E119 Type 2 diabetes mellitus without complications: Secondary | ICD-10-CM | POA: Insufficient documentation

## 2019-12-13 ENCOUNTER — Ambulatory Visit: Payer: 59 | Admitting: Family Medicine

## 2019-12-20 ENCOUNTER — Other Ambulatory Visit: Payer: Self-pay

## 2019-12-20 ENCOUNTER — Ambulatory Visit: Payer: 59 | Admitting: Family Medicine

## 2019-12-24 ENCOUNTER — Encounter: Payer: Self-pay | Admitting: Family Medicine

## 2019-12-24 ENCOUNTER — Telehealth: Payer: Self-pay

## 2019-12-24 ENCOUNTER — Ambulatory Visit (INDEPENDENT_AMBULATORY_CARE_PROVIDER_SITE_OTHER): Payer: 59 | Admitting: Family Medicine

## 2019-12-24 ENCOUNTER — Other Ambulatory Visit: Payer: Self-pay

## 2019-12-24 VITALS — BP 144/100 | HR 76 | Temp 96.9°F | Ht 72.0 in | Wt 289.1 lb

## 2019-12-24 DIAGNOSIS — E785 Hyperlipidemia, unspecified: Secondary | ICD-10-CM

## 2019-12-24 DIAGNOSIS — I10 Essential (primary) hypertension: Secondary | ICD-10-CM

## 2019-12-24 DIAGNOSIS — R0683 Snoring: Secondary | ICD-10-CM

## 2019-12-24 DIAGNOSIS — Z659 Problem related to unspecified psychosocial circumstances: Secondary | ICD-10-CM

## 2019-12-24 DIAGNOSIS — I700 Atherosclerosis of aorta: Secondary | ICD-10-CM

## 2019-12-24 DIAGNOSIS — E119 Type 2 diabetes mellitus without complications: Secondary | ICD-10-CM | POA: Diagnosis not present

## 2019-12-24 MED ORDER — LISINOPRIL 10 MG PO TABS: 10.0000 mg | ORAL_TABLET | Freq: Every day | ORAL | 3 refills | Status: DC

## 2019-12-24 NOTE — Progress Notes (Signed)
This visit occurred during the SARS-CoV-2 public health emergency.  Safety protocols were in place, including screening questions prior to the visit, additional usage of staff PPE, and extensive cleaning of exam room while observing appropriate contact time as indicated for disinfecting solutions.  Multiple issues to consider.  Elevated BP.  Noted on multiple checks.  DM2 by last A1c. D/w pt.  Aortic atherosclerosis noted on previous imaging.  No CP, not SOB, less BLE edema now.   Discussed diet and exercise.  He is changing jobs and is going to work for [employer].  He is going to need a sleep study.  D/w pt.  He snores. Referral ordered.    His son is in a group home.  He has other social stressors.    Recheck BP 150/100.    Meds, vitals, and allergies reviewed.   ROS: Per HPI unless specifically indicated in ROS section   GEN: nad, alert and oriented HEENT: ncat NECK: supple w/o LA CV: rrr.  PULM: ctab, no inc wob ABD: soft, +bs EXT: no edema SKIN: Well-perfused  At least 30 minutes were devoted to patient care in this encounter (this can potentially include time spent reviewing the patient's file/history, interviewing and examining the patient, counseling/reviewing plan with patient, ordering referrals, ordering tests, reviewing relevant laboratory or x-ray data, and documenting the encounter).

## 2019-12-24 NOTE — Telephone Encounter (Signed)
Pt already has an appt scheduled with Dr Para March 12/24/19 at 2:30.

## 2019-12-24 NOTE — Telephone Encounter (Signed)
[facility] - Client TELEPHONE ADVICE RECORD [facility] Patient Name: Riley Howard Gender: Male DOB: 07-20-1961 Age: 59 Y 11 M 11 D Return Phone Number: [phone] (Primary) [place] [address] Client [facility] Day - Client Client Site [facility] - Day Physician Raechel Ache - MD Contact Type Call Who Is Calling Patient / Member / Family / Caregiver Call Type Triage / Clinical Relationship To Patient Self Return Phone Number [phone] (Primary) Chief Complaint Blood Pressure High Reason for Call Symptomatic / Request for Health Information Initial Comment Caller states he have been experiencing high blood pressure. Translation No Nurse Assessment Nurse: Doylene Canard, RN, Rinaldo Cloud Date/Time (Eastern Time): 12/24/2019 10:00:05 AM Confirm and document reason for call. If symptomatic, describe symptoms. ---Caller states his BP is 156/96, earlier was 181/104, has been elevated for 2 days. Denies other s/s. Has the patient had close contact with a person known or suspected to have the novel coronavirus illness OR traveled / lives in area with major community spread (including international travel) in the last 14 days from the onset of symptoms? * If Asymptomatic, screen for exposure and travel within the last 14 days. ---No Does the patient have any new or worsening symptoms? ---Yes Will a triage be completed? ---Yes Related visit to physician within the last 2 weeks? ---No Does the PT have any chronic conditions? (i.e. diabetes, asthma, this includes High risk factors for pregnancy, etc.) ---Yes List chronic conditions. ---HTN, Borderline Diabetic Is this a behavioral health or substance abuse call? ---No Guidelines Guideline Title Affirmed Question Affirmed Notes Nurse Date/Time (Eastern Time) Blood Pressure - High Systolic BP >= 180 OR Diastolic >= 110 Conner, RN, Rinaldo Cloud  12/24/2019 10:04:01 AM Disp. Time Lamount Cohen Time) Disposition Final User 12/24/2019 10:07:54 AM See PCP within 24 Hours Yes Conner, RN, Rinaldo Cloud PLEASE NOTE: All timestamps contained within this report are represented as [place] Standard Time. CONFIDENTIALTY NOTICE: This fax transmission is intended only for the addressee. It contains information that is legally privileged, confidential or otherwise protected from use or disclosure. If you are not the intended recipient, you are strictly prohibited from reviewing, disclosing, copying using or disseminating any of this information or taking any action in reliance on or regarding this information. If you have received this fax in error, please notify us immediately by telephone so that we can arrange for its return to [place]. Phone: [phone], Toll-Free: [phone], Fax: [phone] Page: 2 of 2 Call Id: [id number] Caller Disagree/Comply Comply Caller Understands Yes PreDisposition Did not know what to do Care Advice Given Per Guideline SEE PCP WITHIN 24 HOURS: * IF OFFICE WILL BE OPEN: You need to be seen within the next 24 hours. Call your doctor (or NP/PA) when the office opens and make an appointment. HIGH BLOOD PRESSURE: * Untreated high blood pressure may cause damage to your heart, brain, kidneys, and eyes. * Treatment of high blood pressure can reduce the risk of stroke, heart attack, and heart failure. CALL BACK IF: * Weakness or numbness of the face, arm or leg on one side of the body occurs * Difficulty walking, difficulty talking, or severe headache occurs * Chest pain or difficulty breathing occurs * You become worse. CARE ADVICE given per High Blood Pressure (Adult) guideline. Referrals REFERRED TO PCP OFFICE

## 2019-12-24 NOTE — Telephone Encounter (Signed)
Noted. Thanks.

## 2019-12-24 NOTE — Patient Instructions (Addendum)
We'll call about the pulmonary appointment for a sleep study.  Start taking lisinopril 10mg  a day.  After 1 week, if your BP is still above 140/90, then go up to 20mg  a day.  If lightheaded, then cut back on the dose.   Update me in about 10 days, sooner if needed.   When your BP is better, then try taking lipitor/atorvastatin for cholesterol.  If you have aches on the medicine then let me know.    Push the next appointment back to early June.  Take care.  Glad to see you.

## 2019-12-25 NOTE — Assessment & Plan Note (Signed)
Support offered.  He will update me as needed.  Still okay for outpatient follow-up.  No suicidal or homicidal intent.

## 2019-12-25 NOTE — Assessment & Plan Note (Signed)
Refer for sleep apnea testing.  Appreciate pulmonary input.  Overlap between diabetes, high blood pressure, sleep apnea discussed.

## 2019-12-25 NOTE — Assessment & Plan Note (Signed)
See above.  Start statin after starting ACE inhibitor.  Routine cautions given to patient.

## 2019-12-25 NOTE — Assessment & Plan Note (Signed)
With elevated blood pressure.  He does not have to start medication for his sugar at this point but we talked about rationale for ACE inhibitor use with diabetes, especially given his blood pressure readings.  He can start lisinopril 10 mg with routine cautions.  He can update me his blood pressure in the near future.  If needed he can increase to 20 mg.  Routine cautions against hypotension discussed with patient.  See after visit summary.

## 2019-12-25 NOTE — Assessment & Plan Note (Signed)
We talked about starting 1 medication at a time.  He can start atorvastatin after he has controlled his blood pressure with lisinopril.  Rationale for use especially in the setting of diabetes discussed.

## 2019-12-25 NOTE — Assessment & Plan Note (Signed)
Routine cautions given to patient.  Start lisinopril.  See after visit summary.  He will update me about his blood pressure.

## 2020-01-03 ENCOUNTER — Ambulatory Visit: Payer: 59 | Admitting: Family Medicine

## 2020-01-03 ENCOUNTER — Other Ambulatory Visit: Payer: Self-pay | Admitting: Family Medicine

## 2020-01-03 ENCOUNTER — Encounter: Payer: Self-pay | Admitting: Family Medicine

## 2020-01-03 MED ORDER — LISINOPRIL 10 MG PO TABS: 30.0000 mg | ORAL_TABLET | Freq: Every day | ORAL | Status: DC

## 2020-01-07 ENCOUNTER — Encounter: Payer: Self-pay | Admitting: Family Medicine

## 2020-01-09 ENCOUNTER — Other Ambulatory Visit: Payer: Self-pay | Admitting: Family Medicine

## 2020-01-09 DIAGNOSIS — I10 Essential (primary) hypertension: Secondary | ICD-10-CM

## 2020-01-09 MED ORDER — LISINOPRIL 40 MG PO TABS: 40.0000 mg | ORAL_TABLET | Freq: Every day | ORAL | 1 refills | Status: DC

## 2020-01-13 ENCOUNTER — Telehealth: Payer: Self-pay | Admitting: Family Medicine

## 2020-01-13 ENCOUNTER — Other Ambulatory Visit (INDEPENDENT_AMBULATORY_CARE_PROVIDER_SITE_OTHER): Payer: 59

## 2020-01-13 DIAGNOSIS — I10 Essential (primary) hypertension: Secondary | ICD-10-CM | POA: Diagnosis not present

## 2020-01-13 LAB — BASIC METABOLIC PANEL
BUN: 15 mg/dL (ref 6–23)
CO2: 30 mEq/L (ref 19–32)
Calcium: 9.7 mg/dL (ref 8.4–10.5)
Chloride: 101 mEq/L (ref 96–112)
Creatinine, Ser: 1.16 mg/dL (ref 0.40–1.50)
GFR: 64.67 mL/min (ref >60.00)
Glucose, Bld: 121 mg/dL — ABNORMAL HIGH (ref 70–99)
Potassium: 4.8 mEq/L (ref 3.5–5.1)
Sodium: 137 mEq/L (ref 135–145)

## 2020-01-13 NOTE — Telephone Encounter (Signed)
Pt called to give an update on BP  This AM 161/97, 170/101  HR 61-65 Pt can go outside walking or do any type of exertion and BP will level out,  115-81 , 124-81  HR 111  Pt states that he feels its related to stress  Pt does not understand why he has to get his heart rate up to get his BP down.   Taking meds as prescribed  Please advise, thanks.

## 2020-01-13 NOTE — Telephone Encounter (Signed)
I suspect that as his blood pressure decreases his heart rate increases as a compensatory measure.  If he is able to get his blood pressure down to 115-124 systolic/80s diastolic, and I would not increase his lisinopril at this point.  Fortunately his labs are still reasonable.  I would continue as is for now and see if his blood pressure/pulse level out over the next week or so.  Thanks.

## 2020-01-13 NOTE — Telephone Encounter (Signed)
Patient advised.

## 2020-01-21 ENCOUNTER — Encounter: Payer: Self-pay | Admitting: Family Medicine

## 2020-01-23 ENCOUNTER — Institutional Professional Consult (permissible substitution): Payer: 59 | Admitting: Pulmonary Disease

## 2020-01-27 ENCOUNTER — Encounter: Payer: Self-pay | Admitting: Family Medicine

## 2020-02-07 ENCOUNTER — Encounter: Payer: Self-pay | Admitting: Family Medicine

## 2020-02-08 NOTE — Telephone Encounter (Signed)
Message sent to pt that Dr Para March was out of the office until 5-18. There may be a delay in response

## 2020-02-13 ENCOUNTER — Other Ambulatory Visit: Payer: Self-pay | Admitting: Family Medicine

## 2020-02-13 DIAGNOSIS — E119 Type 2 diabetes mellitus without complications: Secondary | ICD-10-CM

## 2020-02-13 MED ORDER — AMLODIPINE BESYLATE 5 MG PO TABS: 5.0000 mg | ORAL_TABLET | Freq: Every day | ORAL | 3 refills | Status: DC

## 2020-02-13 NOTE — Telephone Encounter (Signed)
Lugene-please make sure a copy of his sleep study is printed off and then filed under procedures.  Thanks.

## 2020-02-20 ENCOUNTER — Encounter: Payer: Self-pay | Admitting: Family Medicine

## 2020-02-21 ENCOUNTER — Telehealth: Payer: Self-pay | Admitting: Family Medicine

## 2020-02-21 NOTE — Telephone Encounter (Signed)
Called patient and left voicemail for patient to call us and to reschedule appointment.

## 2020-02-27 ENCOUNTER — Other Ambulatory Visit: Payer: 59

## 2020-02-27 ENCOUNTER — Ambulatory Visit: Payer: 59 | Admitting: Family Medicine

## 2020-03-09 ENCOUNTER — Encounter: Payer: Self-pay | Admitting: Family Medicine

## 2020-03-09 ENCOUNTER — Ambulatory Visit (INDEPENDENT_AMBULATORY_CARE_PROVIDER_SITE_OTHER): Payer: 59 | Admitting: Family Medicine

## 2020-03-09 ENCOUNTER — Other Ambulatory Visit: Payer: Self-pay

## 2020-03-09 DIAGNOSIS — R0683 Snoring: Secondary | ICD-10-CM

## 2020-03-09 DIAGNOSIS — E119 Type 2 diabetes mellitus without complications: Secondary | ICD-10-CM

## 2020-03-09 DIAGNOSIS — I10 Essential (primary) hypertension: Secondary | ICD-10-CM

## 2020-03-09 DIAGNOSIS — E785 Hyperlipidemia, unspecified: Secondary | ICD-10-CM | POA: Diagnosis not present

## 2020-03-09 LAB — COMPREHENSIVE METABOLIC PANEL
ALT: 45 U/L (ref 0–53)
AST: 28 U/L (ref 0–37)
Albumin: 4.5 g/dL (ref 3.5–5.2)
Alkaline Phosphatase: 45 U/L (ref 39–117)
BUN: 18 mg/dL (ref 6–23)
CO2: 26 mEq/L (ref 19–32)
Calcium: 9.8 mg/dL (ref 8.4–10.5)
Chloride: 105 mEq/L (ref 96–112)
Creatinine, Ser: 1.16 mg/dL (ref 0.40–1.50)
GFR: 64.63 mL/min (ref >60.00)
Glucose, Bld: 117 mg/dL — ABNORMAL HIGH (ref 70–99)
Potassium: 5 mEq/L (ref 3.5–5.1)
Sodium: 137 mEq/L (ref 135–145)
Total Bilirubin: 0.6 mg/dL (ref 0.2–1.2)
Total Protein: 7.4 g/dL (ref 6.0–8.3)

## 2020-03-09 LAB — LIPID PANEL
Cholesterol: 223 mg/dL — ABNORMAL HIGH (ref 0–200)
HDL: 45.2 mg/dL (ref >39.00)
LDL Cholesterol: 148 mg/dL — ABNORMAL HIGH (ref 0–99)
NonHDL: 178.17
Total CHOL/HDL Ratio: 5
Triglycerides: 153 mg/dL — ABNORMAL HIGH (ref 0.0–149.0)
VLDL: 30.6 mg/dL (ref 0.0–40.0)

## 2020-03-09 LAB — HEMOGLOBIN A1C: Hgb A1c MFr Bld: 6.4 % (ref 4.6–6.5)

## 2020-03-09 NOTE — Progress Notes (Signed)
This visit occurred during the SARS-CoV-2 public health emergency.  Safety protocols were in place, including screening questions prior to the visit, additional usage of staff PPE, and extensive cleaning of exam room while observing appropriate contact time as indicated for disinfecting solutions.  Hypertension:    Using medication without problems or lightheadedness:  He likely had rare lightheadedness at work, BP usually 130s/80 at home, on lisinopril.  BP higher under higher stress episodes.   He didn't think the episodes were due to low sugars.   He is working hard at his job and that may be lowering his pressure.   Chest pain with exertion:no Edema:no Short of breath:no On lisinopril 40mg  a day.  Off amlodpine and lipitor currently.   He is working 12 hour shifts, night shift during the week but usually resetting to day shift on the weekend.  We talked about taking lisinopril in the mornings, after his work shift.  He is working on diet and exercise.    He hasn't tried statin yet.  He wanted to recheck his lipids and go from there.  Discussed.  He is on CPAP.  He has a full face mask.  He doesn't feel better yet.  He had an episode where he didn't feel like he could get a breath and his pressure was increased in the meantime.  He felt bloater with higher pressure, so he is set on a medium level now.  The he had trouble with powering up his unit.  I asked him to check with the supplier.  He had overnight testing done.    DM2.  No DM2 meds.  Due for f/u labs.  D/w pt about diet and exercise.  No recent eye exam.  D/w pt.    Meds, vitals, and allergies reviewed.   ROS: Per HPI unless specifically indicated in ROS section   GEN: nad, alert and oriented HEENT: ncat NECK: supple w/o LA CV: rrr. PULM: ctab, no inc wob ABD: soft, +bs EXT: no edema SKIN: well perfused.   18" neck.

## 2020-03-09 NOTE — Patient Instructions (Addendum)
Go to the lab on the way out.   If you have mychart we'll likely use that to update you.     Try taking lisinopril in the mornings, after your work shift. Keep taking it in the mornings on the weekends.  See how that does and let me know.    We'll get your labs and go from there.   Check with the CPAP supplier.  Take care.  Glad to see you.

## 2020-03-11 ENCOUNTER — Other Ambulatory Visit: Payer: Self-pay | Admitting: Family Medicine

## 2020-03-11 MED ORDER — ATORVASTATIN CALCIUM 20 MG PO TABS: 20.0000 mg | ORAL_TABLET | Freq: Every day | ORAL | Status: DC

## 2020-03-11 NOTE — Assessment & Plan Note (Signed)
He hasn't tried statin yet.  He wanted to recheck his lipids and go from there.

## 2020-03-11 NOTE — Assessment & Plan Note (Signed)
I asked him to check with his DME supplier.  See after visit summary.  He agrees.

## 2020-03-11 NOTE — Assessment & Plan Note (Signed)
No DM2 meds.  Due for f/u labs.  D/w pt about diet and exercise.  No recent eye exam.  D/w pt.

## 2020-03-11 NOTE — Assessment & Plan Note (Signed)
See notes on labs.  Discussed options Advised to try taking lisinopril in the mornings, after his work shift. Keep taking it in the mornings on the weekends.  See how that does and let me know.  He agrees.  Continue work on diet and exercise.

## 2020-04-20 ENCOUNTER — Emergency Department: Payer: Medicaid Other

## 2020-04-20 ENCOUNTER — Emergency Department
Admission: EM | Admit: 2020-04-20 | Discharge: 2020-04-20 | Disposition: A | Discharge status: Home / Self Care | Payer: Medicaid Other | Attending: Physician Assistant | Admitting: Physician Assistant

## 2020-04-20 DIAGNOSIS — R109 Unspecified abdominal pain: Secondary | ICD-10-CM | POA: Insufficient documentation

## 2020-04-20 DIAGNOSIS — N281 Cyst of kidney, acquired: Secondary | ICD-10-CM | POA: Insufficient documentation

## 2020-04-20 DIAGNOSIS — N23 Unspecified renal colic: Secondary | ICD-10-CM

## 2020-04-20 LAB — CBC AND DIFFERENTIAL
Basophils %: 0.2 % (ref 0.0–3.0)
Basophils Absolute: 0 10*3/uL (ref 0.0–0.3)
Eosinophils %: 0.3 % (ref 0.0–7.0)
Eosinophils Absolute: 0 10*3/uL (ref 0.0–0.8)
Hematocrit: 51.7 % (ref 39.0–52.5)
Hemoglobin: 17.7 gm/dL — ABNORMAL HIGH (ref 13.0–17.5)
Lymphocytes Absolute: 1.6 10*3/uL (ref 0.6–5.1)
Lymphocytes: 9.3 % — ABNORMAL LOW (ref 15.0–46.0)
MCH: 32 pg (ref 28–35)
MCHC: 34 gm/dL (ref 32–36)
MCV: 94 fL (ref 80–100)
MPV: 7.5 fL (ref 6.0–10.0)
Monocytes Absolute: 0.9 10*3/uL (ref 0.1–1.7)
Monocytes: 5.4 % (ref 3.0–15.0)
Neutrophils %: 84.8 % — ABNORMAL HIGH (ref 42.0–78.0)
Neutrophils Absolute: 14.8 10*3/uL — ABNORMAL HIGH (ref 1.7–8.6)
PLT CT: 266 10*3/uL (ref 130–440)
RBC: 5.5 10*6/uL (ref 4.00–5.70)
RDW: 12.5 % (ref 11.0–14.0)
WBC: 17.5 10*3/uL — ABNORMAL HIGH (ref 4.0–11.0)

## 2020-04-20 LAB — COMPREHENSIVE METABOLIC PANEL
ALT: 18 U/L (ref 0–55)
AST (SGOT): 17 U/L (ref 10–42)
Albumin/Globulin Ratio: 1.17 Ratio (ref 0.80–2.00)
Albumin: 4.1 gm/dL (ref 3.5–5.0)
Alkaline Phosphatase: 84 U/L (ref 40–145)
Anion Gap: 14.5 mMol/L (ref 7.0–18.0)
BUN / Creatinine Ratio: 7.6 Ratio — ABNORMAL LOW (ref 10.0–30.0)
BUN: 10 mg/dL (ref 7–22)
Bilirubin, Total: 1.1 mg/dL (ref 0.1–1.2)
CO2: 23 mMol/L (ref 20–30)
Calcium: 10 mg/dL (ref 8.5–10.5)
Chloride: 107 mMol/L (ref 98–110)
Creatinine: 1.31 mg/dL — ABNORMAL HIGH (ref 0.80–1.30)
EGFR: 60 mL/min/{1.73_m2} (ref 60–150)
Globulin: 3.5 gm/dL (ref 2.0–4.0)
Glucose: 152 mg/dL — ABNORMAL HIGH (ref 71–99)
Osmolality Calculated: 281 mOsm/kg (ref 275–300)
Potassium: 4.5 mMol/L (ref 3.5–5.3)
Protein, Total: 7.6 gm/dL (ref 6.0–8.3)
Sodium: 140 mMol/L (ref 136–147)

## 2020-04-20 LAB — VH URINALYSIS WITH MICROSCOPIC IF INDICATED
Bilirubin, UA: NEGATIVE
Glucose, UA: 50 mg/dL — AB
Ketones UA: NEGATIVE mg/dL
Leukocyte Esterase, UA: NEGATIVE Leu/uL
Nitrite, UA: NEGATIVE
Protein, UR: 30 mg/dL — AB
RBC, UA: >182 /hpf — ABNORMAL HIGH (ref 0–4)
Urine Specific Gravity: 1.015 (ref 1.001–1.040)
Urobilinogen, UA: NORMAL mg/dL
WBC, UA: 3 /hpf (ref 0–4)
pH, Urine: 7 pH (ref 5.0–8.0)

## 2020-04-20 MED ORDER — CEPHALEXIN 500 MG PO CAPS
500.0000 mg | ORAL_CAPSULE | Freq: Three times a day (TID) | ORAL | 0 refills | Status: AC
Start: 2020-04-20 — End: 2020-04-25

## 2020-04-20 MED ORDER — KETOROLAC TROMETHAMINE 15 MG/ML IJ SOLN
15.0000 mg | Freq: Once | INTRAMUSCULAR | Status: AC
Start: 2020-04-20 — End: 2020-04-20
  Administered 2020-04-20: 15 mg via INTRAVENOUS

## 2020-04-20 MED ORDER — STERILE WATER FOR INJECTION IJ SOLN
1.0000 g | Freq: Once | INTRAMUSCULAR | Status: AC
Start: 2020-04-20 — End: 2020-04-20
  Administered 2020-04-20: 1 g via INTRAVENOUS

## 2020-04-20 MED ORDER — SODIUM CHLORIDE 0.9 % IV BOLUS
1000.0000 mL | Freq: Once | INTRAVENOUS | Status: AC
Start: 2020-04-20 — End: 2020-04-20
  Administered 2020-04-20: 1000 mL via INTRAVENOUS

## 2020-04-20 MED ORDER — KETOROLAC TROMETHAMINE 15 MG/ML IJ SOLN
INTRAMUSCULAR | Status: AC
Start: 2020-04-20 — End: ?
  Filled 2020-04-20: qty 1

## 2020-04-20 MED ORDER — CEFTRIAXONE SODIUM 1 G IJ SOLR
INTRAMUSCULAR | Status: AC
Start: 2020-04-20 — End: ?
  Filled 2020-04-20: qty 1000

## 2020-04-20 MED ORDER — STERILE WATER FOR INJECTION IJ SOLN
INTRAMUSCULAR | Status: AC
Start: 2020-04-20 — End: ?
  Filled 2020-04-20: qty 10

## 2020-04-20 MED ORDER — OXYCODONE-ACETAMINOPHEN 5-325 MG PO TABS
1.0000 | ORAL_TABLET | ORAL | 0 refills | Status: AC | PRN
Start: 2020-04-20 — End: ?

## 2020-04-20 MED ORDER — TAMSULOSIN HCL 0.4 MG PO CAPS
0.4000 mg | ORAL_CAPSULE | Freq: Every day | ORAL | 0 refills | Status: AC
Start: 2020-04-20 — End: ?

## 2020-04-20 NOTE — Discharge Instructions (Signed)
Kidney Stone with Pain    The sharp cramping pain on either side of your lower back and nausea or vomiting that you have are because ofa small stone that has formed in the kidney. It's now passing down a narrow tube (ureter) on its way to your bladder. Once the stone reaches your bladder, the pain will often stop. But it may come back as the stone continues to pass out of the bladder and through the urethra. The stone may pass in your urine stream in one piece. The size may be 1/16 inch to 1/4 inch (1 mm to 6 mm). Or, the stone may break up into sandy fragments that you may not even notice.   Once you have had a kidney stone, you are at risk of getting another one in the future. There are 4 types of kidney stones. Eighty percent are calcium stones--mostly calcium oxalate but also some with calcium phosphate. The other 3 types include uric acid stones, struvite stones (from a preceding infection), and rarely, cystine stones.   Most stones will pass on their own, but may take from a few hours to a few days. Sometimes the stone is too large to pass by itself. In that case, the healthcare provider will need to useother ways to remove the stone. These techniques include:    Lithotripsy. Thisuses ultrasound waves to break up the stone.   Ureteroscopy. Thispushes a basket-like instrument through the urethra and bladder and into the ureter to pull out the stone.   Surgery. You may need surgery to remove the stone.  Home care  The following are general care guidelines:   Drink plenty of fluids. This means at least 12, 8-ounce glasses of fluid--mostly water--a day.   Each time you urinate, do so in a jar. Pour the urine from the jar through the strainer and into the toilet. Continue doing this until 24 hours after your pain stops. By then, if there was a kidney stone, it should pass from your bladder. Some stones dissolve into sand-like particles and pass right through the strainer. In that case, you won't ever see  a stone.   Save any stone that you find in the strainer and bring it to your healthcare provider to look at. It may be possible to stop certain types of stones from forming. For this reason, it's important to know what kind of stone you have.   Try to stay as active as possible. This will help the stone pass. Don't stay in bed unless your pain keeps you from getting up. You may notice a red, pink, or brown color to your urine. This is normal while passing a kidney stone.   If you develop pain, you may take ibuprofen or naproxen for pain, unless another medicine was prescribed.Talk with your healthcare provider before using these medicines if you have chronic liver or kidney disease. Also talk with your provider if you'vehad a stomach ulcer or digestive bleeding.  Preventingstones  Each year for the next 5 to7 years,you are at risk thatanew stone will form. Your risk is a 50% chance over this time period.The risk is higherif you have a family history of kidney stones or have certain chronic illnesses like high blood pressure, obesity, ordiabetes.But you can makechanges to your lifestyle and diet that can lower yourrisk for another stone.   Most kidney stones are made of calcium. The following is advice for preventing anothercalcium stone.If you don't know the type of stone you have, follow   this advice until the cause of your stone is found.   Things that help:   The most important thing you can do is to drink plenty of fluids each day. See home care above.   Eat foods that contain phytates. These includewheat, rice, rye, barley, and beans. Phytates are substances that may lower your risk forany type of stone to form.   Eat more fruits and vegetables.Choose those that arehigh in potassium.   Eat foods high in natural citrate such asfruit and low-sugar fruit juices, such as lemonade and orange juice. Citrate can protect against kidney stones because it stops crystals from turning into  stones   Having too little calcium in your diet can put you at risk forcalciumkidney stones. Eat a normal amount of calcium in your diet and talkwith your healthcare providerif you are taking calcium supplements. Cutting back on your calcium intake may raise your risk.New research shows that eating calcium-rich and oxalate-rich foods together lowers your risk for stones by binding the minerals in the stomach and intestines before they can reach the kidneys.    Limit salt intake to 2grams (1teaspoon) per day. High sodium in your diet will increase the amount of calcium sent into your urine. This can increase your chances of developing another stone. Use limited amounts when cooking, and don't add salt at the table.Processed and canned foods are usually high in salt.   Spinach, rhubarb, peanuts, cashews, almonds, grapefruit, and grapefruit juice are all high oxalate foods. You should limit how much of these you eat. Oreat them withcalcium-rich foods. These include dairy products, dark leafy greens, soy products, and calcium-enriched foods.   Reducing the amount of animal meat and high protein foods in your diet may lower your risk for uric acid stones. These foods have high amounts of a natural chemical compound called purines. Eating a lot of food with purines can make your body produce more uric acid.   Limit the amount of sugar (sucrose) and soft drinks with fructose inyourdiet.   If you take vitamin C as a supplement, don't take more than 1,000 mg a day.   A dietitian or your healthcare provider can give youinformation aboutchanges in your diet that will help prevent morekidney stones from forming.  Follow-up care  Follow up with yourhealthcare provider, or as advised,if the pain lasts more than 48 hours. Talk with your provider about urine and blood tests to find out the cause of your stone. If you had an X-ray, CT scan, or other diagnostic test, you will be told of any new findings  that may affect your care.   Call 911  Call 911 if you have:    Weakness, dizziness, or fainting  When to seek medical advice  Call your healthcare provider right away if any of these occur:   Pain that is not controlled by the medicine given   Repeated vomiting or unable to keep down fluids   Fever of 100.4F (38C) or higher, or as directed by your healthcare provider   Passage of solid red or brown urine (can't see through it) or urine with lots of blood clots   Foul-smelling or cloudy urine   Unable to pass urine for 8 hours and increasing bladder pressure  [employer] last reviewed this educational content on 11/25/2018   2000-2021 [employer]. All rights reserved. This information is not intended as a substitute for professional medical care. Always follow your healthcare professional's instructions.      Thank   you for choosing [facility] for your emergency care needs. We strive to provide EXCELLENT care to you and your family.      YOUR ACCURATE CONTACT INFORMATION IS VERY IMPORTANT    Before leaving please check with registration to make sure we have an up-to-date contact number. A Toll-free post discharge customer service number is available to update your registration/insurance information as well as answer any billing questions or concerns. That number is [phone].       IF YOU DO NOT CONTINUE TO IMPROVE OR YOUR CONDITION WORSENS, PLEASE CONTACT YOUR DOCTOR OR RETURN IMMEDIATELY TO THE EMERGENCEY DEPARTMENT.      EXTRA AVAILABLE RESOURCES:    1. DOCTOR REFERRALS  a. Call  our Physician Referral Line @ [phone] If you need  further assistance with referrals to primary care or specialty services our Case Manager may assist at [phone].  2. FREE HEALTH SERVICES  a. www.freemedicalsearch.org  b. http://www.[facility].org  May be utilized if you need help with health or social services, please call 2-1-1 for a free referral to resources in your area. 2-1-1 is  a free service connecting people with information on health insurance, free clinics, pregnancy, mental health, dental care, food assistance, housing, and substance abuse counseling.  3. MEDICAL RECORDS AND TESTS  Certain laboratory test results do not come back the same day, for example: urine cultures may take 3 days. We will attempt to contact you if other important findings are noted. Some lab testing may take 2-5 days. Radiology films are reviewed again to ensure accuracy. If there is any discrepancy, we will notify you. If you have questions or concerns, our Case Manager is available Monday thru Friday, 7:30a-3:00p, for follow up or test result questions. Please call [phone].  4. ED PATIENT ADVOCATE SERVICES: [phone]. Contact for general questions and concerns, daily 11:00a-11:00p.      DISCHARGE MESSAGE:     YOU ARE THE MOST IMPORTANT FACTOR IN YOUR RECOVERY. Follow the above instructions carefully. Take your medicines as prescribed. Most important, see your doctor in follow-up as recommended by your ED physician.  Call your doctor if your condition worsens or if you have any new, worsening, or severe symptoms. If you need further care and cannot be seen by your recommended follow-up doctor, the Emergency Department Case Manager will be available to help as needed [phone].  If you require immediate assistance, return to the Emergency Department or call 911.    Pharmacy information  For your convenience please visit [facility] for your prescription needs. [facility] has extended evening and weekend hours, including holidays, to ensure patients can begin taking medications as soon as possible.  Most insurance plans are accepted.    Pharmacy Hours:  Monday - Friday: 8:30a to 8:30p  Saturday: 9a to 1p  Sunday: 9a to 1p and 6p to 9p  Holidays: 9a to 1p    Pharmacy Phone: [phone]      [facility] has been providing home care solutions for independent living since 1984.  Serv[place]. [facility] is a full service home medical provider of home oxygen and respiratory care, medical equipment and supplies. [phone].    Thanks Again, for allowing [facility]   Emergency Department to serve you.

## 2020-04-20 NOTE — ED Provider Notes (Signed)
[facility]  EMERGENCY DEPARTMENT  History and Physical Exam     Patient Name: Sean Berg, Sean Berg  Encounter Date:  04/20/2020  Treating Provider: Arlice Colt, PA-C  Supervising Physician: Myna Bright, MD   Room:  RAUB/RAU-B  Patient DOB:  November 24, 1960  Age: 59 y.o. male  MRN:  [id number]  PCP: Marisa Sprinkles, MD      Diagnosis/Disposition:  ED Course/MDM:     Final Impression  1. Flank pain    2. Renal cyst    3. Renal colic        Disposition  ED Disposition     ED Disposition Condition Date/Time Comment    Discharge  Mon Apr 20, 2020  4:21 PM Patrici Ranks discharge to home/self care.    Condition at disposition: Stable          Follow up  Elmon Else, MD  [address]  [place] [address]  [phone]            Prescriptions  Discharge Medication List as of 04/20/2020  4:21 PM      START taking these medications    Details   cephalexin (KEFLEX) 500 MG capsule Take 1 capsule (500 mg total) by mouth 3 (three) times daily for 5 days, Starting Mon 04/20/2020, Until Sat 04/25/2020, E-Rx      oxyCODONE-acetaminophen (PERCOCET) 5-325 MG per tablet Take 1 tablet by mouth every 4 (four) hours as needed for Pain, Starting Mon 04/20/2020, Normal      tamsulosin (FLOMAX) 0.4 MG Cap Take 1 capsule (0.4 mg total) by mouth Daily after dinner, Starting Mon 04/20/2020, E-Rx             ED Course as of Apr 20 1928   Mon Apr 20, 2020   1621 Consult: Dr. Genelle Gather (urology).  Patient is asymptomatic at this point and may have already passed the stone.  We will follow-up in clinic.  Due to leukocytosis recommend it may be reasonable for a few days of antibiotics pending urine culture.  Urine does not appear grossly infected and patient has no sepsis indicators.  I discussed this with the patient and spouse at bedside who understand and agree with the plan.    [BM]   1621 Benign serial exam.  Pain completely resolved after toradol    [BM]      ED Course User Index  [BM] Lynnae Prude, PA        This patient presents to the Emergency Department with flank pain.  The patient underwent evaluation and treatment and initial differential diagnosis included but was not limited to kidney stone, AAA, dissection, musculoskeletal back pain, and other causes of intra-abdominal pathology.  Life-threatening or serious causes of this patients pain were thought unlikely.  The patient underwent evaluation and treatment and their symptoms have improved.  They have no signs of sepsis or acutely worsening kidney failure.  Their pain is controlled and they are able to keep fluids down. The patient was warned to return immediately for fever/chills, intractable pain, persistent vomiting, worsening symptoms or any concerns.  Oral hydration and close re-evaluation with a specialist is recommended.  Diagnostic impression and plan were discussed with the patient and/or family.  Results of lab/radiology tests were discussed with the patient and/or family.  All questions were answered and concerns addressed.          History of Presenting Illness:     Nurse Triage: onset this am left flank pain with  difficulty urinating. Roxy Cedar        Chief complaint: Flank Pain      HPI/ROS given by: patient, unless otherwise stated    Sean Berg is a 59 y.o. male presenting with left leg pain and difficulty urinating which started this morning.  Has had some nausea but no vomiting.  Denies any fever chills.  Denies any known significant past medical history.  States pain seems to be intermittent and radiates from the back to the left groin.        ROS:  Physical Exam:     Review of Systems   Constitutional: Negative.  Negative for chills and fever.   HENT: Negative.    Eyes: Negative.  Negative for blurred vision.   Respiratory: Negative.  Negative for cough and shortness of breath.    Cardiovascular: Negative.  Negative for chest pain.   Gastrointestinal: Negative.  Negative for abdominal pain.   Genitourinary: Positive for flank pain.    Musculoskeletal: Negative for joint pain and myalgias.   Skin: Negative.  Negative for rash.   Neurological: Negative.  Negative for dizziness, sensory change, focal weakness and headaches.   Endo/Heme/Allergies: Negative.    Psychiatric/Behavioral: Negative.    All other systems reviewed and are negative.      Complete review of systems is otherwise negative except as indicated in the HPI.  Blood pressure 146/88, pulse 62, temperature 97.6 F (36.4 C), temperature source Skin, resp. rate 16, weight 89.1 kg, SpO2 97 %.     Constitutional: WD/WN, active, NAD   Eyes: PERRL.  Conjunctiva pink, sclera anicteric, no discharge.  Visual acuity grossly intact.    HEENT:  NCAT. Ears: No external lesions.   Nose: No external lesions or discharge.    Oropharynx clear, moist mucous membranes.   Neck: Trachea midline. No JVD. Normal ROM. No apparent masses.  Respiratory: Effort normal without accessory muscle use. Lungs clear with vesicular bases.  Cardiovascular: RRR, no MRG.  No appreciable peripheral edema. Brisk cap refill.  Abdomen: Soft, non-tender, non-distended. No palpable masses or hernias.  Left CVAT  Musculoskeletal: Normal range of motion. No deformity or apparent injury. Strength grossly intact bilateral legs  Neurological: Pt is alert. No focal motor deficits. Speech normal. CN II-XII grossly normal.   Psychiatric: Affect is appropriate. There is no agitation.   Skin: Warm and dry. No rash.       PROCEDURES               EKG:     EKG:   Last EKG Result     None               Diagnostic Results:     All labs and imaging personally reviewed.     RADIOLOGIC STUDIES    CT Abdomen Pelvis dry  Larina Bras)    Result Date: 04/20/2020  1. Enlarged left kidney with moderate perirenal stranding. Mild left hydronephroureter. 2. No stone visualized along the upper tracts, ureters, or bladder. However, there is a small calcification at the urethral meatus felt likely from near-imminent stone passage. 3. Limited unenhanced  assessment with additional findings above. 4. Superimposed pyelonephritis would be difficult to exclude. Correlate with clinical findings. ReadingStation:[id number]      LAB STUDIES    Results     Procedure Component Value Units Date/Time    Urine Culture [[id number]] Collected: 04/20/20 1300    Specimen: Urine from Clean Catch Updated: 04/20/20 1640    Urinalysis with Microscopic if  Indicated [[id number]]  (Abnormal) Collected: 04/20/20 1300    Specimen: Urine, Random Updated: 04/20/20 1319     Color, UA Yellow     Clarity, UA Clear     Urine Specific Gravity 1.015     pH, Urine 7.0 pH      Protein, UR 30 mg/dL      Glucose, UA 50 mg/dL      Ketones UA Negative mg/dL      Bilirubin, UA Negative     Blood, UA Large     Nitrite, UA Negative     Urobilinogen, UA Normal mg/dL      Leukocyte Esterase, UA Negative Leu/uL      UR Micro Performed     WBC, UA 3 /hpf      RBC, UA >182 /hpf     Comprehensive metabolic panel [[id number]]  (Abnormal) Collected: 04/20/20 1220    Specimen: Plasma Updated: 04/20/20 1256     Sodium 140 mMol/L      Potassium 4.5 mMol/L      Chloride 107 mMol/L      CO2 23 mMol/L      Calcium 10.0 mg/dL      Glucose 782 mg/dL      Creatinine 9.56 mg/dL      BUN 10 mg/dL      Protein, Total 7.6 gm/dL      Albumin 4.1 gm/dL      Alkaline Phosphatase 84 U/L      ALT 18 U/L      AST (SGOT) 17 U/L      Bilirubin, Total 1.1 mg/dL      Albumin/Globulin Ratio 1.17 Ratio      Anion Gap 14.5 mMol/L      BUN / Creatinine Ratio 7.6 Ratio      EGFR 60 mL/min/1.51m2      Osmolality Calculated 281 mOsm/kg      Globulin 3.5 gm/dL     CBC and differential [[id number]]  (Abnormal) Collected: 04/20/20 1220    Specimen: Blood Updated: 04/20/20 1233     WBC 17.5 K/cmm      RBC 5.50 M/cmm      Hemoglobin 17.7 gm/dL      Hematocrit 46.9 %      MCV 94 fL      MCH 32 pg      MCHC 34 gm/dL      RDW 62.9 %      PLT CT 266 K/cmm      MPV 7.5 fL      Neutrophils % 84.8 %      Lymphocytes 9.3 %      Monocytes 5.4 %      Eosinophils %  0.3 %      Basophils % 0.2 %      Neutrophils Absolute 14.8 K/cmm      Lymphocytes Absolute 1.6 K/cmm      Monocytes Absolute 0.9 K/cmm      Eosinophils Absolute 0.0 K/cmm      Basophils Absolute 0.0 K/cmm              ORDERS PLACED THIS VISIT     Orders  Orders Placed This Encounter   Procedures    Urine Culture    CT Abdomen Pelvis dry  (Stone)    CBC and differential    Comprehensive metabolic panel    Urinalysis with Microscopic if Indicated       Medications  Medications   sodium chloride 0.9 % bolus 1,000 mL (  0 mLs Intravenous Stopped 04/20/20 1414)   ketorolac (TORADOL) injection 15 mg (15 mg Intravenous Given 04/20/20 1314)   cefTRIAXone (ROCEPHIN) 1 g in sterile water (preservative free) 10 mL Injection (1 g Intravenous Given 04/20/20 1623)              Allergies & Medications:     Pt is allergic to sulfa antibiotics.    Discharge Medication List as of 04/20/2020  4:21 PM              Past History:     I personally reviewed past history    Medical: History reviewed. No pertinent past medical history.    Surgical:   Past Surgical History:   Procedure Laterality Date    LUNG BIOPSY         Family:   Family History   Problem Relation Age of Onset    Heart disease Mother     Kidney failure Sister     Heart disease Brother        Social:  reports that he has been smoking. He has never used smokeless tobacco. He reports that he does not drink alcohol and does not use drugs.        ATTESTATIONS     Arlice Colt, PA-C    The results of diagnostic studies have been reviewed by myself. The above past medical, family, social, and surgical histories have been reviewed by myself.     Note:  This chart was generated by an EMR and may contain errors, including typographical, or omissions not intended by the user. This chart was generated by the Epic EMR system/speech recognition and may contain inherent errors or omissions not intended by the user. Grammatical errors, random word insertions, deletions, pronoun  errors and incomplete sentences are occasional consequences of this technology due to software limitations. Not all errors are caught or corrected. If there are questions or concerns about the content of this note or information contained within the body of this dictation they should be addressed directly with the author for clarification          Lynnae Prude, [place]  04/20/20 1929       Myna Bright, MD  04/21/20 [phone]

## 2020-04-23 LAB — VH CULTURE, URINE

## 2021-02-01 ENCOUNTER — Encounter: Payer: 59 | Admitting: Family Medicine
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
## 2021-10-28 ENCOUNTER — Encounter: Payer: 59 | Admitting: Family Medicine

## 2021-11-30 ENCOUNTER — Telehealth: Payer: Self-pay

## 2021-11-30 NOTE — Telephone Encounter (Signed)
Pt's wife called stating he is having elevated BP. Ran out of BP medication. Numbers have ranged from 158/90 to 174/101. Pt denies Headache, nausea, chest pain, or arm pain. Pt declined going to ER or UC. Wants to be seen here at [facility]. Made appt with [facility] 12-02-21. He will go to the ER if symptoms change. ?

## 2021-12-02 ENCOUNTER — Ambulatory Visit: Payer: 59 | Admitting: Nurse Practitioner

## 2021-12-02 ENCOUNTER — Encounter: Payer: Self-pay | Admitting: Nurse Practitioner

## 2021-12-02 ENCOUNTER — Other Ambulatory Visit: Payer: Self-pay

## 2021-12-02 VITALS — BP 158/96 | HR 70 | Temp 97.6°F | Resp 14 | Ht 72.0 in | Wt 290.4 lb

## 2021-12-02 DIAGNOSIS — E119 Type 2 diabetes mellitus without complications: Secondary | ICD-10-CM | POA: Diagnosis not present

## 2021-12-02 DIAGNOSIS — I10 Essential (primary) hypertension: Secondary | ICD-10-CM

## 2021-12-02 DIAGNOSIS — E785 Hyperlipidemia, unspecified: Secondary | ICD-10-CM

## 2021-12-02 LAB — CBC
HCT: 43.8 % (ref 39.0–52.0)
Hemoglobin: 15 g/dL (ref 13.0–17.0)
MCHC: 34.2 g/dL (ref 30.0–36.0)
MCV: 84.1 fl (ref 78.0–100.0)
Platelets: 180 10*3/uL (ref 150.0–400.0)
RBC: 5.21 Mil/uL (ref 4.22–5.81)
RDW: 13 % (ref 11.5–15.5)
WBC: 5.2 10*3/uL (ref 4.0–10.5)

## 2021-12-02 LAB — LIPID PANEL
Cholesterol: 215 mg/dL — ABNORMAL HIGH (ref 0–200)
HDL: 49.2 mg/dL (ref >39.00)
LDL Cholesterol: 139 mg/dL — ABNORMAL HIGH (ref 0–99)
NonHDL: 165.55
Total CHOL/HDL Ratio: 4
Triglycerides: 134 mg/dL (ref 0.0–149.0)
VLDL: 26.8 mg/dL (ref 0.0–40.0)

## 2021-12-02 LAB — COMPREHENSIVE METABOLIC PANEL
ALT: 26 U/L (ref 0–53)
AST: 21 U/L (ref 0–37)
Albumin: 4.5 g/dL (ref 3.5–5.2)
Alkaline Phosphatase: 54 U/L (ref 39–117)
BUN: 16 mg/dL (ref 6–23)
CO2: 28 mEq/L (ref 19–32)
Calcium: 9.7 mg/dL (ref 8.4–10.5)
Chloride: 101 mEq/L (ref 96–112)
Creatinine, Ser: 1.17 mg/dL (ref 0.40–1.50)
GFR: 68.05 mL/min (ref >60.00)
Glucose, Bld: 104 mg/dL — ABNORMAL HIGH (ref 70–99)
Potassium: 4.1 mEq/L (ref 3.5–5.1)
Sodium: 137 mEq/L (ref 135–145)
Total Bilirubin: 0.9 mg/dL (ref 0.2–1.2)
Total Protein: 7.2 g/dL (ref 6.0–8.3)

## 2021-12-02 LAB — HEMOGLOBIN A1C: Hgb A1c MFr Bld: 6.2 % (ref 4.6–6.5)

## 2021-12-02 MED ORDER — LISINOPRIL 40 MG PO TABS: 40.0000 mg | ORAL_TABLET | Freq: Every day | ORAL | 0 refills | Status: DC

## 2021-12-02 NOTE — Progress Notes (Signed)
Established Patient Office Visit  Subjective:  Patient ID: Riley Howard, male    DOB: [birth year]  Age: 61 y.o. MRN: [id number]  CC:  Chief Complaint  Patient presents with   Hypertension    150s/90s, the past week or so.    HPI Riley CareWilliam C Petrides presents for BP refill  States that he was maintained on lisinopril 40mg . States that he has been out of the medication for approx one year. States lots of stress at home.   150s/90 something when he is checking them at home. States that he does no regularly check his blood pressure but he has been under a lot of stress lately and did check it.  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 This has increased his walking. Patient states that he has changed waist size but has not lost any weight.   Past Medical History:  Diagnosis Date   Diabetes mellitus without complication (HCC)    Hyperlipidemia    Hypertension    Pneumonia in child     No past surgical history on file.  Family History  Problem Relation Age of Onset   Diabetes Mother    Dementia Mother        Organic Brain Syndrome   Bipolar disorder Mother    Schizophrenia Mother    Heart disease Mother        MI   Hypertension Mother    Depression Mother    CAD Father    Cancer Sister        Unknown type   Obesity Sister    Hypertension Brother    COPD Brother    Bipolar disorder Brother    Hyperlipidemia Brother    Hypertension Brother    Hyperlipidemia Brother    COPD Brother    Diabetes Brother    Alcohol abuse Neg Hx    Drug abuse Neg Hx    Stroke Neg Hx    Colon cancer Neg Hx    Prostate cancer Neg Hx     Social History   Socioeconomic History   Marital status: Married    Spouse name: Not on file   Number of children: 0   Years of education: Not on file   Highest education level: Not on file  Occupational History   Occupation: CNA, former Naval architectTruck Driver, 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  Tobacco Use    Smoking status: Never   Smokeless tobacco: Never  Substance and Sexual Activity   Alcohol use: No   Drug use: No   Sexual activity: Not on file  Other Topics Concern   Not on file  Social History Narrative   Married 1993, lives with wife   2 adopted children, son (who has mental illness) and daughter   From [place]   Trucker, local routes as of 2020, 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      NON FORMULARY Sweet Ease after each meal     lisinopril (ZESTRIL) 40 MG tablet Take  1 tablet (40 mg total) by mouth daily. (Patient not taking: Reported on 12/02/2021) 90 tablet 1   atorvastatin (LIPITOR) 20 MG tablet Take 1 tablet (20 mg total) by mouth daily.     No facility-administered medications prior to visit.    No Known Allergies  ROS Review of Systems  Constitutional:  Negative for chills, fatigue and fever.  Eyes:  Negative for visual disturbance.  Respiratory:  Negative for cough and shortness of breath.   Cardiovascular:  Negative for chest pain and leg swelling.  Gastrointestinal:  Negative for diarrhea, nausea and vomiting.       BM daily  Genitourinary:  Negative for difficulty urinating.       Nocturia "-"  Neurological:  Negative for dizziness, weakness, light-headedness, numbness and headaches.     Objective:    Physical Exam Vitals and nursing note reviewed.  Constitutional:      Appearance: He is obese.  HENT:     Right Ear: Tympanic membrane, ear canal and external ear normal.     Left Ear: Tympanic membrane, ear canal and external ear  normal.     Mouth/Throat:     Mouth: Mucous membranes are moist.     Pharynx: Oropharynx is clear.  Eyes:     Extraocular Movements: Extraocular movements intact.     Pupils: Pupils are equal, round, and reactive to light.  Cardiovascular:     Rate and Rhythm: Normal rate and regular rhythm.     Heart sounds: Normal heart sounds.  Pulmonary:     Effort: Pulmonary effort is normal.     Breath sounds: Normal breath sounds.  Abdominal:     General: Bowel sounds are normal.  Musculoskeletal:     Right lower leg: 1+ Pitting Edema present.     Left lower leg: 1+ Pitting Edema present.  Lymphadenopathy:     Cervical: No cervical adenopathy.  Neurological:     Mental Status: He is alert.     Comments: Bilateral upper and lower extremity strength 5/5    BP (!) 158/96    Pulse 70    Temp 97.6 F (36.4 C)    Resp 14    Ht 6' (1.829 m)    Wt 290 lb 7 oz (131.7 kg)    SpO2 98%    BMI 39.39 kg/m  Wt Readings from Last 3 Encounters:  12/02/21 290 lb 7 oz (131.7 kg)  03/09/20 288 lb 7 oz (130.8 kg)  12/24/19 289 lb 2 oz (131.1 kg)     Health Maintenance Due  Topic Date Due   FOOT EXAM  Never done   OPHTHALMOLOGY EXAM  Never done   COLONOSCOPY (Pts 45-77yrs Insurance coverage will need to be confirmed)  Never done   Zoster Vaccines- Shingrix (1 of 2) Never done   TETANUS/TDAP  08/04/2018   HEMOGLOBIN A1C  09/08/2020    There are no preventive care reminders to display for this patient.  Lab Results  Component Value Date   TSH 2.44 08/16/2019   Lab Results  Component Value Date   WBC 5.0 08/16/2019   HGB 15.8 08/16/2019   HCT 46.5 08/16/2019   MCV 85.5 08/16/2019   PLT 180.0 08/16/2019   Lab Results  Component Value Date   NA 137 03/09/2020   K 5.0 03/09/2020   CO2 26 03/09/2020   GLUCOSE 117 (H) 03/09/2020   BUN 18 03/09/2020   CREATININE 1.16 03/09/2020   BILITOT 0.6 03/09/2020   ALKPHOS 45 03/09/2020  AST 28 03/09/2020   ALT 45 03/09/2020   PROT 7.4 03/09/2020    ALBUMIN 4.5 03/09/2020   CALCIUM 9.8 03/09/2020   GFR 64.63 03/09/2020   Lab Results  Component Value Date   CHOL 223 (H) 03/09/2020   Lab Results  Component Value Date   HDL 45.20 03/09/2020   Lab Results  Component Value Date   LDLCALC 148 (H) 03/09/2020   Lab Results  Component Value Date   TRIG 153.0 (H) 03/09/2020   Lab Results  Component Value Date   CHOLHDL 5 03/09/2020   Lab Results  Component Value Date   HGBA1C 6.4 03/09/2020      Assessment & Plan:   Problem List Items Addressed This Visit       Cardiovascular and Mediastinum   Essential hypertension - Primary    Patient was maintained on lisinopril 40.  Has been off medication approximately a year.  Recently check blood pressure this past week noticed it was elevated.  We will restart patient on lisinopril 40 mg daily.  Did ask patient to check blood pressure once a day for the next week and let us know what the readings are.  Also check basic labs today.  Informed him we will need to recheck potassium and kidneys after resuming medication, he has an appoint with PCP in 1 month.      Relevant Medications   lisinopril (ZESTRIL) 40 MG tablet   Other Relevant Orders   CBC   Comprehensive metabolic panel     Endocrine   Diabetes mellitus without complication (HCC)    Noted in the chart we will check A1c today since patient has not been seen in a year and a half.      Relevant Medications   lisinopril (ZESTRIL) 40 MG tablet   Other Relevant Orders   Hemoglobin A1c     Other   HLD (hyperlipidemia)    We will recheck today since patient not been seen in a year and a half.  Not currently on statin medication.  Patient did state he was fasting.      Relevant Medications   lisinopril (ZESTRIL) 40 MG tablet   Other Relevant Orders   Lipid panel    No orders of the defined types were placed in this encounter.   Follow-up: Return for as scheduled with Dr. Para March.  This visit occurred during  the SARS-CoV-2 public health emergency.  Safety protocols were in place, including screening questions prior to the visit, additional usage of staff PPE, and extensive cleaning of exam room while observing appropriate contact time as indicated for disinfecting solutions.     Audria Nine, NP

## 2021-12-02 NOTE — Assessment & Plan Note (Signed)
Patient was maintained on lisinopril 40.  Has been off medication approximately a year.  Recently check blood pressure this past week noticed it was elevated.  We will restart patient on lisinopril 40 mg daily.  Did ask patient to check blood pressure once a day for the next week and let us know what the readings are.  Also check basic labs today.  Informed him we will need to recheck potassium and kidneys after resuming medication, he has an appoint with PCP in 1 month. ?

## 2021-12-02 NOTE — Assessment & Plan Note (Signed)
We will recheck today since patient not been seen in a year and a half.  Not currently on statin medication.  Patient did state he was fasting. ?

## 2021-12-02 NOTE — Patient Instructions (Signed)
Nice to see you today ?I will be in touch with the lab results once I get them ?I want you to check your blood pressure daily for the next week and let us know via mychart what they are running.  ?Follow up with Dr. Para March as scheduled in April ?Follow up sooner if needed ?

## 2021-12-02 NOTE — Assessment & Plan Note (Signed)
Noted in the chart we will check A1c today since patient has not been seen in a year and a half. ?

## 2021-12-03 ENCOUNTER — Other Ambulatory Visit: Payer: Self-pay | Admitting: Nurse Practitioner

## 2021-12-03 ENCOUNTER — Encounter: Payer: Self-pay | Admitting: Nurse Practitioner

## 2021-12-03 DIAGNOSIS — E785 Hyperlipidemia, unspecified: Secondary | ICD-10-CM

## 2021-12-03 MED ORDER — ATORVASTATIN CALCIUM 20 MG PO TABS: 20.0000 mg | ORAL_TABLET | Freq: Every day | ORAL | 0 refills | Status: DC

## 2021-12-09 MED ORDER — HYDROCHLOROTHIAZIDE 12.5 MG PO TABS: 12.5000 mg | ORAL_TABLET | Freq: Every day | ORAL | 0 refills | Status: DC

## 2021-12-23 NOTE — Telephone Encounter (Signed)
Patient's appt for 4.4.23 needed to be rescheduled due to systems error ? ?Patient's physical moved to 4.28.23 ? ?Wife states her husband will not take a statin ? ?Does want to make sure he has enough hctz & lisinopril until the appointment.  ? ?

## 2021-12-26 ENCOUNTER — Other Ambulatory Visit: Payer: Self-pay | Admitting: Family Medicine

## 2021-12-26 DIAGNOSIS — I10 Essential (primary) hypertension: Secondary | ICD-10-CM

## 2021-12-26 MED ORDER — HYDROCHLOROTHIAZIDE 12.5 MG PO TABS: 12.5000 mg | ORAL_TABLET | Freq: Every day | ORAL | 0 refills | Status: DC

## 2021-12-26 MED ORDER — LISINOPRIL 40 MG PO TABS: 40.0000 mg | ORAL_TABLET | Freq: Every day | ORAL | 0 refills | Status: DC

## 2021-12-28 ENCOUNTER — Encounter: Payer: 59 | Admitting: Family Medicine

## 2022-01-21 ENCOUNTER — Ambulatory Visit (INDEPENDENT_AMBULATORY_CARE_PROVIDER_SITE_OTHER): Payer: 59 | Admitting: Family Medicine

## 2022-01-21 ENCOUNTER — Encounter: Payer: Self-pay | Admitting: Family Medicine

## 2022-01-21 VITALS — BP 122/80 | HR 70 | Temp 98.0°F | Ht 72.0 in | Wt 291.0 lb

## 2022-01-21 DIAGNOSIS — I10 Essential (primary) hypertension: Secondary | ICD-10-CM | POA: Diagnosis not present

## 2022-01-21 DIAGNOSIS — Z7189 Other specified counseling: Secondary | ICD-10-CM

## 2022-01-21 DIAGNOSIS — G4733 Obstructive sleep apnea (adult) (pediatric): Secondary | ICD-10-CM

## 2022-01-21 DIAGNOSIS — Z1211 Encounter for screening for malignant neoplasm of colon: Secondary | ICD-10-CM

## 2022-01-21 DIAGNOSIS — Z8639 Personal history of other endocrine, nutritional and metabolic disease: Secondary | ICD-10-CM

## 2022-01-21 DIAGNOSIS — Z0000 Encounter for general adult medical examination without abnormal findings: Secondary | ICD-10-CM | POA: Diagnosis not present

## 2022-01-21 DIAGNOSIS — Z23 Encounter for immunization: Secondary | ICD-10-CM | POA: Diagnosis not present

## 2022-01-21 LAB — BASIC METABOLIC PANEL
BUN: 20 mg/dL (ref 6–23)
CO2: 27 mEq/L (ref 19–32)
Calcium: 9.3 mg/dL (ref 8.4–10.5)
Chloride: 104 mEq/L (ref 96–112)
Creatinine, Ser: 1.12 mg/dL (ref 0.40–1.50)
GFR: 71.65 mL/min (ref >60.00)
Glucose, Bld: 105 mg/dL — ABNORMAL HIGH (ref 70–99)
Potassium: 4.4 mEq/L (ref 3.5–5.1)
Sodium: 138 mEq/L (ref 135–145)

## 2022-01-21 MED ORDER — LISINOPRIL 40 MG PO TABS: 40.0000 mg | ORAL_TABLET | Freq: Every day | ORAL | 3 refills | Status: DC

## 2022-01-21 MED ORDER — HYDROCHLOROTHIAZIDE 12.5 MG PO TABS: 12.5000 mg | ORAL_TABLET | Freq: Every day | ORAL | 3 refills | Status: DC

## 2022-01-21 NOTE — Patient Instructions (Addendum)
Tetanus shot today.  ?Check to see if cologuard is covered.  I sent the order in the meantime.  ?Go to the lab on the way out.   If you have mychart we'll likely use that to update you.    ?Take care.  Glad to see you. ?Check with your insurance to see if they will cover the shingles shot. ?We'll call about seeing pulmonary.  ?

## 2022-01-21 NOTE — Progress Notes (Signed)
CPE- See plan.  Routine anticipatory guidance given to patient.  See health maintenance.  The possibility exists that previously documented standard health maintenance information may have been brought forward from a previous encounter into this note.  If needed, that same information has been updated to reflect the current situation based on today's encounter.   ? ?Tetanus 2023 ?Flu encouraged.   ?PNA not due  ?Shingles d/w pt.   ?Covid vaccine d/w pt.   ?[facility] for colon cancer screening, including IFOB vs. colonoscopy. Risks and benefits of both were discussed and patient voiced understanding. Pt elects for: cologuard.  ?Prostate cancer screening and PSA options (with potential risks and benefits of testing vs not testing) were discussed along with recent recs/guidelines. He declined testing PSA at this point.  ?Diet and exercise d/w pt. He is walking more at work.   ?Living will d/w pt. Wife designated if patient were incapacitated. ?He prev had HCV and HIV screening with prev employment, per patient report.   ? ?He had sleep apnea eval and has CPAP.  Compliant.  He was asking about repeating his sleep study.  Referral placed.   ? ?Hypertension:    ?Using medication without problems or lightheadedness: yes ?Chest pain with exertion: no ?Edema: no ?Short of breath: no ? ?His daughter has needed inpatient psychiatry tx, d/w pt.  No SI/HI.  Support offered.  He will update me as needed. ? ?He is walking more and getting more exercise at work.  He is wearing smaller pants now.  I thanked him for his effort.  No longer diabetic based on most recent A1c but his A1c is not all the way back to normal.  Discussed. ? ?Had been taking apple cider vinegar.   ? ?PMH and SH reviewed ? ?Meds, vitals, and allergies reviewed.  ? ?ROS: Per HPI.  Unless specifically indicated otherwise in HPI, the patient denies: ? ?General: fever. ?Eyes: acute vision changes ?ENT: sore throat ?Cardiovascular: chest  pain ?Respiratory: SOB ?GI: vomiting ?GU: dysuria ?Musculoskeletal: acute back pain ?Derm: acute rash ?Neuro: acute motor dysfunction ?Psych: worsening mood ?Endocrine: polydipsia ?Heme: bleeding ?Allergy: hayfever ? ?GEN: nad, alert and oriented ?HEENT: ncat ?NECK: supple w/o LA ?CV: rrr. ?PULM: ctab, no inc wob ?ABD: soft, +bs ?EXT: no edema ?SKIN: no acute rash ?

## 2022-01-23 DIAGNOSIS — G4733 Obstructive sleep apnea (adult) (pediatric): Secondary | ICD-10-CM | POA: Insufficient documentation

## 2022-01-23 NOTE — Assessment & Plan Note (Signed)
He is walking more and getting more exercise at work.  He is wearing smaller pants now.  I thanked him for his effort.  No longer diabetic based on most recent A1c but his A1c is not all the way back to normal.  Discussed.  Continue work on diet and exercise. ?

## 2022-01-23 NOTE — Assessment & Plan Note (Signed)
Tetanus 2023 ?Flu encouraged.   ?PNA not due  ?Shingles d/w pt.   ?Covid vaccine d/w pt.   ?D/w patient [facility] for colon cancer screening, including IFOB vs. colonoscopy. Risks and benefits of both were discussed and patient voiced understanding. Pt elects for: cologuard.  ?Prostate cancer screening and PSA options (with potential risks and benefits of testing vs not testing) were discussed along with recent recs/guidelines. He declined testing PSA at this point.  ?Diet and exercise d/w pt. He is walking more at work.   ?Living will d/w pt. Wife designated if patient were incapacitated. ?He prev had HCV and HIV screening with prev employment, per patient report.   ?

## 2022-01-23 NOTE — Assessment & Plan Note (Signed)
Blood pressure is improved.  Continue work on diet and exercise.  Recheck bmet today given restart of medication.  Discussed sleep apnea management.  Continue lisinopril and hydrochlorothiazide. ?

## 2022-01-23 NOTE — Assessment & Plan Note (Signed)
Living will d/w pt.  Wife designated if patient were incapacitated.   ?

## 2022-01-23 NOTE — Assessment & Plan Note (Signed)
He had sleep apnea eval and has CPAP.  Compliant.  He was asking about repeating his sleep study.  Referral placed.   ?

## 2022-01-31 ENCOUNTER — Telehealth: Payer: Self-pay | Admitting: Family Medicine

## 2022-01-31 DIAGNOSIS — Z1211 Encounter for screening for malignant neoplasm of colon: Secondary | ICD-10-CM

## 2022-01-31 NOTE — Telephone Encounter (Signed)
Pt wanted to know if Dr Para March could set him up with a colonoscopy, call back is [phone] ?

## 2022-01-31 NOTE — Telephone Encounter (Signed)
Mykah Shin (Spouse) called stating she is returning the nurse's call. She states "she thinks it is for a test, but unsure." ? ?Callback Number: [phone] ?

## 2022-01-31 NOTE — Telephone Encounter (Signed)
[email or web address]

## 2022-02-01 NOTE — Telephone Encounter (Signed)
Called patients wife back as requested. She stated patient no longer wants to do cologuard that was discussed at [facility] and would like colonoscopy done instead. She requested this to be done at [facility] if that is possible and her number needs to be the one called when this gets scheduled at [phone]. ?

## 2022-02-01 NOTE — Telephone Encounter (Signed)
Spoke with patients wife; this is a duplicate phone note.  ?

## 2022-02-01 NOTE — Telephone Encounter (Signed)
Patient sent a message through scheduling pool " have requested per phone call on 02/01/2022 to have a colonoscopy. Janett Billow called and left a voicemail for me to return her call. A phone call was returned, the person that answered the phone would not allow me to leave a voicemail for [place], nor speak with her. " ?

## 2022-02-02 NOTE — Addendum Note (Signed)
Addended by: Wendie Simmer B on: 02/02/2022 02:25 PM ? ? Modules accepted: Orders ? ?

## 2022-02-02 NOTE — Telephone Encounter (Signed)
Okay.  I cancelled the referral.  Please make sure he still has an active cologuard order.  Thanks.  ?

## 2022-02-02 NOTE — Telephone Encounter (Signed)
Called and spoke with patient about orders. Advised patient that I canceled the cologuard order for him as requested and colonoscopy has been ordered. Patient stated that he just did the cologuard and was sending it in the mail. He no longer wants to do the colonoscopy. Patient stated that only reason he was fighting the cologuard is because it states in the packet he received with the kit that there can be a high number of false neg or positive results with these and it made him nervous.  ?

## 2022-02-02 NOTE — Telephone Encounter (Signed)
Please cancel the Cologuard order.  I put in the referral.  Thanks. ?

## 2022-02-02 NOTE — Addendum Note (Signed)
Addended by: Joaquim Nam on: 02/02/2022 04:38 PM ? ? Modules accepted: Orders ? ?

## 2022-02-02 NOTE — Telephone Encounter (Signed)
Cologuard order has been redone. ?

## 2022-02-02 NOTE — Addendum Note (Signed)
Addended by: Wendie Simmer B on: 02/02/2022 02:31 PM ? ? Modules accepted: Orders ? ?

## 2022-02-02 NOTE — Addendum Note (Signed)
Addended by: Joaquim Nam on: 02/02/2022 11:37 AM ? ? Modules accepted: Orders ? ?

## 2022-02-13 LAB — COLOGUARD: COLOGUARD: NEGATIVE

## 2022-04-06 ENCOUNTER — Ambulatory Visit
Admission: EM | Admit: 2022-04-06 | Discharge: 2022-04-06 | Disposition: A | Discharge status: Home / Self Care | Payer: 59

## 2022-04-06 DIAGNOSIS — R051 Acute cough: Secondary | ICD-10-CM | POA: Diagnosis not present

## 2022-04-06 DIAGNOSIS — J069 Acute upper respiratory infection, unspecified: Secondary | ICD-10-CM

## 2022-04-06 DIAGNOSIS — A084 Viral intestinal infection, unspecified: Secondary | ICD-10-CM | POA: Diagnosis not present

## 2022-04-06 NOTE — Discharge Instructions (Addendum)
Your symptoms and exam findings are most consistent with a viral upper respiratory infection. These usually run their course in about 10 days.  If your symptoms last longer than 10 days without improvement, please follow up with your primary care provider.  If your symptoms, worsen, please go to the Emergency Room.    We have tested you today for COVID-19 and influenza.  You will see the results in Mychart and we will call you with positive results.    Please stay home and isolate until you are aware of the results.    Some things that can make you feel better are: - Increased rest - Increasing fluid with water/sugar free electrolytes - Acetaminophen and ibuprofen as needed for fever/pain.  - Salt water gargling, chloraseptic spray and throat lozenges - OTC guaifenesin (Mucinex).  - Saline sinus flushes or a neti pot.  - Humidifying the air. 

## 2022-04-06 NOTE — ED Provider Notes (Signed)
[facility]    CSN: [id number] Arrival date & time: 04/06/22  1047      History   Chief Complaint Chief Complaint  Patient presents with   Cough   Weakness   Nausea    HPI Riley Howard is a 61 y.o. male.   Patient presents with 2 days of stuff nose, cough, congestion, chills and sweating, post nasal drainage, intermittent abdominal cramping with non bloody diarrhea, nausea without vomiting, and decreased appetite.  He denies chest pain, shortness of breath, current abdominal pain, new rash.  Reports he has been able to keep fluids down okay.  Has been taking Phenergan and Nyquil for symptoms with relief.  Reports daughter has similar symptoms and daughter caught "something" from her boss at work.     Past Medical History:  Diagnosis Date   Diabetes mellitus without complication (HCC)    Hyperlipidemia    Hypertension    Pneumonia in child     Patient Active Problem List   Diagnosis Date Noted   OSA on CPAP 01/23/2022   History of diabetes mellitus 12/01/2019   Other social stressor 08/18/2019   Snoring 08/18/2019   Aortic atherosclerosis (HCC) 08/18/2019   Skin lesion 05/16/2017   Routine general medical examination at a health care facility 05/01/2015   Advance care planning 05/01/2015   Obesity, unspecified 04/10/2014   Other malaise and fatigue 04/10/2014   Essential hypertension 08/04/2008   HLD (hyperlipidemia) 07/31/2008    History reviewed. No pertinent surgical history.     Home Medications    Prior to Admission medications   Medication Sig Start Date End Date Taking? Authorizing Provider  Promethazine HCl (PHENERGAN PO) Take by mouth.   Yes [provider]  atenolol (TENORMIN) 50 MG tablet 1 tablet Orally Once a day for 30 day(s)    [provider]  hydrochlorothiazide (HYDRODIURIL) 12.5 MG tablet Take 1 tablet (12.5 mg total) by mouth daily. 01/21/22   Joaquim Nam, MD  lisinopril (ZESTRIL) 40 MG tablet  Take 1 tablet (40 mg total) by mouth daily. 01/21/22   Joaquim Nam, MD  Multiple Vitamin (MULTIVITAMIN) tablet Take 1 tablet by mouth in the morning and at bedtime.     [provider]  NON FORMULARY Sweet Ease after each meal    [provider]    Family History Family History  Problem Relation Age of Onset   Diabetes Mother    Dementia Mother        Organic Brain Syndrome   Bipolar disorder Mother    Schizophrenia Mother    Heart disease Mother        MI   Hypertension Mother    Depression Mother    CAD Father    Cancer Sister        Unknown type   Obesity Sister    Hypertension Brother    COPD Brother    Bipolar disorder Brother    Hyperlipidemia Brother    Hypertension Brother    Hyperlipidemia Brother    COPD Brother    Diabetes Brother    Alcohol abuse Neg Hx    Drug abuse Neg Hx    Stroke Neg Hx    Colon cancer Neg Hx    Prostate cancer Neg Hx     Social History Social History   Tobacco Use   Smoking status: Never   Smokeless tobacco: Never  Substance Use Topics   Alcohol use: No   Drug use: No  Allergies   Patient has no known allergies.   Review of Systems Review of Systems Per HPI  Physical Exam Triage Vital Signs ED Triage Vitals  Enc Vitals Group     BP 04/06/22 1135 (!) 156/95     Pulse Rate 04/06/22 1135 90     Resp 04/06/22 1135 20     Temp 04/06/22 1135 99.1 F (37.3 C)     Temp Source 04/06/22 1135 Oral     SpO2 04/06/22 1135 95 %     Weight --      Height --      Head Circumference --      Peak Flow --      Pain Score 04/06/22 1138 0     Pain Loc --      Pain Edu? --      Excl. in GC? --    No data found.  Updated Vital Signs BP (!) 156/95 (BP Location: Right Arm)   Pulse 90   Temp 99.1 F (37.3 C) (Oral)   Resp 20   SpO2 95%   Visual Acuity Right Eye Distance:   Left Eye Distance:   Bilateral Distance:    Right Eye Near:   Left Eye Near:    Bilateral Near:     Physical  Exam Vitals and nursing note reviewed.  Constitutional:      General: He is not in acute distress.    Appearance: Normal appearance. He is not ill-appearing or toxic-appearing.  HENT:     Head: Normocephalic and atraumatic.     Right Ear: Ear canal and external ear normal. A middle ear effusion is present.     Left Ear: Ear canal and external ear normal. A middle ear effusion is present.     Nose: Congestion and rhinorrhea present.     Mouth/Throat:     Mouth: Mucous membranes are moist.     Pharynx: Oropharynx is clear. Posterior oropharyngeal erythema present. No oropharyngeal exudate.  Eyes:     General: No scleral icterus.    Extraocular Movements: Extraocular movements intact.  Cardiovascular:     Rate and Rhythm: Normal rate and regular rhythm.  Pulmonary:     Effort: Pulmonary effort is normal. No respiratory distress.     Breath sounds: Normal breath sounds. No wheezing, rhonchi or rales.  Abdominal:     General: Abdomen is flat. Bowel sounds are normal. There is no distension.     Palpations: Abdomen is soft.     Tenderness: There is no abdominal tenderness. There is no right CVA tenderness, left CVA tenderness or guarding.  Musculoskeletal:     Cervical back: Normal range of motion and neck supple.  Lymphadenopathy:     Cervical: Cervical adenopathy present.  Skin:    General: Skin is warm and dry.     Coloration: Skin is not jaundiced or pale.     Findings: No erythema or rash.  Neurological:     Mental Status: He is alert and oriented to person, place, and time.     Motor: No weakness.  Psychiatric:        Behavior: Behavior is cooperative.      UC Treatments / Results  Labs (all labs ordered are listed, but only abnormal results are displayed) Labs Reviewed  COVID-19, FLU A+B NAA    EKG   Radiology No results found.  Procedures Procedures (including critical care time)  Medications Ordered in UC Medications - No data to display  Initial  Impression / Assessment and Plan / UC Course  I have reviewed the triage vital signs and the nursing notes.  Pertinent labs & imaging results that were available during my care of the patient were reviewed by me and considered in my medical decision making (see chart for details).    Patient is a very pleasant, well appearing 61 year old male.  I suspect his symptoms are viral in nature.  Will obtain influenza and COVID-19 testing.  He would be a good candidate for antiviral therapy given age and comorbidities.  Discussed supportive care to help manage symptoms at home and encouraged use of Coricidin products as I suspect elevated blood pressure may be related to OTC pseudoephedrine use. Note given for work. Seek care if symptoms persist or worsen despite treatment.    Final Clinical Impressions(s) / UC Diagnoses   Final diagnoses:  Acute cough  Viral URI with cough  Viral gastroenteritis     Discharge Instructions      Your symptoms and exam findings are most consistent with a viral upper respiratory infection. These usually run their course in about 10 days.  If your symptoms last longer than 10 days without improvement, please follow up with your primary care provider.  If your symptoms, worsen, please go to the Emergency Room.    We have tested you today for COVID-19 and influenza.  You will see the results in Mychart and we will call you with positive results.    Please stay home and isolate until you are aware of the results.    Some things that can make you feel better are: - Increased rest - Increasing fluid with water/sugar free electrolytes - Acetaminophen and ibuprofen as needed for fever/pain.  - Salt water gargling, chloraseptic spray and throat lozenges - OTC guaifenesin (Mucinex).  - Saline sinus flushes or a neti pot.  - Humidifying the air.     ED Prescriptions   None    PDMP not reviewed this encounter.   Valentino Nose, NP 04/06/22 1245

## 2022-04-06 NOTE — ED Triage Notes (Signed)
Pt reports chills, cough, nausea, sinus pressure, weakness x 2 days. Phenergan gives relief with nausea; NyQuilk gives relief with cough

## 2022-04-07 LAB — COVID-19, FLU A+B NAA
Influenza A, NAA: DETECTED — AB
Influenza B, NAA: NOT DETECTED
SARS-CoV-2, NAA: NOT DETECTED

## 2022-06-16 ENCOUNTER — Encounter: Payer: Self-pay | Admitting: Family

## 2022-06-16 DIAGNOSIS — Z125 Encounter for screening for malignant neoplasm of prostate: Secondary | ICD-10-CM

## 2022-06-16 DIAGNOSIS — F17210 Nicotine dependence, cigarettes, uncomplicated: Secondary | ICD-10-CM

## 2022-06-16 DIAGNOSIS — Z122 Encounter for screening for malignant neoplasm of respiratory organs: Secondary | ICD-10-CM

## 2022-06-16 DIAGNOSIS — K029 Dental caries, unspecified: Secondary | ICD-10-CM

## 2022-06-16 DIAGNOSIS — Z1211 Encounter for screening for malignant neoplasm of colon: Secondary | ICD-10-CM

## 2022-06-16 DIAGNOSIS — M2550 Pain in unspecified joint: Secondary | ICD-10-CM

## 2022-06-16 DIAGNOSIS — K047 Periapical abscess without sinus: Secondary | ICD-10-CM

## 2022-06-16 DIAGNOSIS — E663 Overweight: Secondary | ICD-10-CM

## 2022-07-18 ENCOUNTER — Ambulatory Visit
Admission: RE | Admit: 2022-07-18 | Discharge: 2022-07-18 | Disposition: A | Discharge status: Home / Self Care | Payer: No Typology Code available for payment source | Source: Ambulatory Visit | Attending: Family | Admitting: Family

## 2022-07-18 DIAGNOSIS — Z1211 Encounter for screening for malignant neoplasm of colon: Secondary | ICD-10-CM | POA: Insufficient documentation

## 2022-07-18 DIAGNOSIS — K047 Periapical abscess without sinus: Secondary | ICD-10-CM

## 2022-07-18 DIAGNOSIS — K029 Dental caries, unspecified: Secondary | ICD-10-CM

## 2022-07-18 DIAGNOSIS — M2550 Pain in unspecified joint: Secondary | ICD-10-CM

## 2022-07-18 DIAGNOSIS — Z122 Encounter for screening for malignant neoplasm of respiratory organs: Secondary | ICD-10-CM | POA: Insufficient documentation

## 2022-07-18 DIAGNOSIS — E663 Overweight: Secondary | ICD-10-CM | POA: Insufficient documentation

## 2022-07-18 DIAGNOSIS — F17210 Nicotine dependence, cigarettes, uncomplicated: Secondary | ICD-10-CM

## 2022-07-18 DIAGNOSIS — Z125 Encounter for screening for malignant neoplasm of prostate: Secondary | ICD-10-CM

## 2022-12-21 ENCOUNTER — Telehealth: Payer: Self-pay | Admitting: Family Medicine

## 2022-12-21 NOTE — Telephone Encounter (Signed)
Tried to call patient and got his voicemail. Left a message on voicemail for patient to call the office back and ask to speak with a nurse.

## 2022-12-21 NOTE — Telephone Encounter (Signed)
Noted. Will d/w pt at [place].  Thanks.

## 2022-12-21 NOTE — Telephone Encounter (Signed)
Unable to reach pt by phone and left v/m requesting pt to cb.sending to [place] triage.

## 2022-12-21 NOTE — Telephone Encounter (Signed)
I spoke with pt and for 2 - 2 1/2 wks pt noticed bright red blood when had BM; other times has dark & slimy stools that are diarrhea like and at other times has normal BMs. Pt said he has been working out at gym and [employer]. Pt wondered if wt lifting might have caused some internal hemorrhoids. Pt said no fever, no abd pain and in no distress at this time. Pt wants to wait and see Dr Damita Dunnings; pt already has appt with Dr Damita Dunnings on 12/26/22 at [place] pt sooner appt with different provider but pt prefers to see Dr Damita Dunnings. Pt was given UC & ED precautions and pt voiced understanding. Pt said he would not lift weights until he sees Dr Damita Dunnings also. Pt said in previous note he had asked about possibly checking a cologuard test but pt said he wondered if he should have a colonoscopy since pt has never had one. Pt said he would discuss with Dr Damita Dunnings on 12/26/22. Sending note to Dr Damita Dunnings who is out of office and Dr Danise Mina who is in office.

## 2022-12-21 NOTE — Telephone Encounter (Signed)
Patient returned call,and refused to be triaged. He said that it was only a small amount of blood in his stool.He wanted to know if a cologuard kit could be sent to him? He said that he would just wait until Monday to come in and see Dr Damita Dunnings.

## 2022-12-21 NOTE — Telephone Encounter (Signed)
Pt scheduled appt via mychart for blood in rectum on 12/26/22 with Damita Dunnings. Called pt to transfer him to access nurse, spoke to wife. Pt's wife stated the pt was currently at work & asked if he could return our call. Call back # [phone]

## 2022-12-26 ENCOUNTER — Encounter: Payer: Self-pay | Admitting: Family Medicine

## 2022-12-26 ENCOUNTER — Ambulatory Visit: Payer: 59 | Admitting: Family Medicine

## 2022-12-26 VITALS — BP 136/72 | HR 76 | Temp 98.4°F | Ht 72.0 in | Wt 297.0 lb

## 2022-12-26 DIAGNOSIS — K625 Hemorrhage of anus and rectum: Secondary | ICD-10-CM

## 2022-12-26 DIAGNOSIS — I10 Essential (primary) hypertension: Secondary | ICD-10-CM

## 2022-12-26 MED ORDER — LISINOPRIL 40 MG PO TABS: 40.0000 mg | ORAL_TABLET | Freq: Every day | ORAL | 3 refills | Status: DC

## 2022-12-26 MED ORDER — HYDROCHLOROTHIAZIDE 12.5 MG PO TABS: 12.5000 mg | ORAL_TABLET | Freq: Every day | ORAL | 3 refills | Status: DC

## 2022-12-26 MED ORDER — HYDROCORTISONE ACETATE 25 MG RE SUPP: 25.0000 mg | Freq: Two times a day (BID) | RECTAL | 0 refills | Status: DC

## 2022-12-26 NOTE — Progress Notes (Unsigned)
Intermittent BRBPR with wiping.  Small amount on the toilet paper.  No blood o/w.  Joined a gym, has been working out on the weekend.  No rectal pain.  No abd pain.  No vomiting, rare diarrhea.  No black stools.  No known external hemorrhoid.  He reports 2 episodes of blood on the toilet paper.  Neg cologuard last year.    Meds, vitals, and allergies reviewed.   ROS: Per HPI unless specifically indicated in ROS section   Nad Ncat Neck supple, no LA Rrr Ctab Abd soft, not ttp He deferred rectal exam.

## 2022-12-26 NOTE — Patient Instructions (Signed)
If you have more bleeding then start using the hydrocortisone suppository.  If not better, or if other symptoms, then let me know.  Take care.  Glad to see you.

## 2022-12-28 DIAGNOSIS — K625 Hemorrhage of anus and rectum: Secondary | ICD-10-CM | POA: Insufficient documentation

## 2022-12-28 NOTE — Assessment & Plan Note (Signed)
Relevant anatomy discussed with patient.  External rectal exam declined by patient.  This limits available data.  Discussed. Presumed internal hemorrhoids with scant bleeding episodically.  No symptoms now. If more bleeding then start using the hydrocortisone suppository.  If not better, or if other symptoms, then let me know.

## 2023-01-01 ENCOUNTER — Other Ambulatory Visit: Payer: Self-pay | Admitting: Family Medicine

## 2023-01-01 DIAGNOSIS — I10 Essential (primary) hypertension: Secondary | ICD-10-CM

## 2023-01-01 DIAGNOSIS — R739 Hyperglycemia, unspecified: Secondary | ICD-10-CM

## 2023-01-20 ENCOUNTER — Other Ambulatory Visit (INDEPENDENT_AMBULATORY_CARE_PROVIDER_SITE_OTHER): Payer: 59

## 2023-01-20 DIAGNOSIS — R739 Hyperglycemia, unspecified: Secondary | ICD-10-CM | POA: Diagnosis not present

## 2023-01-20 DIAGNOSIS — I10 Essential (primary) hypertension: Secondary | ICD-10-CM | POA: Diagnosis not present

## 2023-01-20 LAB — CBC WITH DIFFERENTIAL/PLATELET
Basophils Absolute: 0 10*3/uL (ref 0.0–0.1)
Basophils Relative: 0.8 % (ref 0.0–3.0)
Eosinophils Absolute: 0.2 10*3/uL (ref 0.0–0.7)
Eosinophils Relative: 3.8 % (ref 0.0–5.0)
HCT: 43.9 % (ref 39.0–52.0)
Hemoglobin: 15.1 g/dL (ref 13.0–17.0)
Lymphocytes Relative: 32.3 % (ref 12.0–46.0)
Lymphs Abs: 1.8 10*3/uL (ref 0.7–4.0)
MCHC: 34.4 g/dL (ref 30.0–36.0)
MCV: 83.5 fl (ref 78.0–100.0)
Monocytes Absolute: 0.5 10*3/uL (ref 0.1–1.0)
Monocytes Relative: 8.6 % (ref 3.0–12.0)
Neutro Abs: 3 10*3/uL (ref 1.4–7.7)
Neutrophils Relative %: 54.5 % (ref 43.0–77.0)
Platelets: 198 10*3/uL (ref 150.0–400.0)
RBC: 5.25 Mil/uL (ref 4.22–5.81)
RDW: 12.8 % (ref 11.5–15.5)
WBC: 5.4 10*3/uL (ref 4.0–10.5)

## 2023-01-20 LAB — LIPID PANEL
Cholesterol: 213 mg/dL — ABNORMAL HIGH (ref 0–200)
HDL: 44.1 mg/dL (ref >39.00)
LDL Cholesterol: 140 mg/dL — ABNORMAL HIGH (ref 0–99)
NonHDL: 168.71
Total CHOL/HDL Ratio: 5
Triglycerides: 142 mg/dL (ref 0.0–149.0)
VLDL: 28.4 mg/dL (ref 0.0–40.0)

## 2023-01-20 LAB — COMPREHENSIVE METABOLIC PANEL
ALT: 45 U/L (ref 0–53)
AST: 23 U/L (ref 0–37)
Albumin: 4.3 g/dL (ref 3.5–5.2)
Alkaline Phosphatase: 55 U/L (ref 39–117)
BUN: 16 mg/dL (ref 6–23)
CO2: 28 mEq/L (ref 19–32)
Calcium: 9.3 mg/dL (ref 8.4–10.5)
Chloride: 103 mEq/L (ref 96–112)
Creatinine, Ser: 1.3 mg/dL (ref 0.40–1.50)
GFR: 59.5 mL/min — ABNORMAL LOW (ref >60.00)
Glucose, Bld: 87 mg/dL (ref 70–99)
Potassium: 4.6 mEq/L (ref 3.5–5.1)
Sodium: 139 mEq/L (ref 135–145)
Total Bilirubin: 0.7 mg/dL (ref 0.2–1.2)
Total Protein: 7 g/dL (ref 6.0–8.3)

## 2023-01-20 LAB — HEMOGLOBIN A1C: Hgb A1c MFr Bld: 6.7 % — ABNORMAL HIGH (ref 4.6–6.5)

## 2023-01-22 ENCOUNTER — Encounter: Payer: Self-pay | Admitting: Family Medicine

## 2023-01-22 DIAGNOSIS — E119 Type 2 diabetes mellitus without complications: Secondary | ICD-10-CM | POA: Insufficient documentation

## 2023-01-27 ENCOUNTER — Ambulatory Visit (INDEPENDENT_AMBULATORY_CARE_PROVIDER_SITE_OTHER): Payer: 59 | Admitting: Family Medicine

## 2023-01-27 ENCOUNTER — Encounter: Payer: Self-pay | Admitting: Family Medicine

## 2023-01-27 VITALS — BP 138/78 | HR 75 | Temp 98.0°F | Ht 72.0 in | Wt 291.0 lb

## 2023-01-27 DIAGNOSIS — Z0000 Encounter for general adult medical examination without abnormal findings: Secondary | ICD-10-CM

## 2023-01-27 DIAGNOSIS — I10 Essential (primary) hypertension: Secondary | ICD-10-CM

## 2023-01-27 DIAGNOSIS — Z659 Problem related to unspecified psychosocial circumstances: Secondary | ICD-10-CM

## 2023-01-27 DIAGNOSIS — G4733 Obstructive sleep apnea (adult) (pediatric): Secondary | ICD-10-CM

## 2023-01-27 DIAGNOSIS — E119 Type 2 diabetes mellitus without complications: Secondary | ICD-10-CM

## 2023-01-27 DIAGNOSIS — Z7189 Other specified counseling: Secondary | ICD-10-CM

## 2023-01-27 NOTE — Patient Instructions (Addendum)
See about getting a repeat A1c done at [facility] at their lab after 04/29/23.   If you need a [employer] order, then let me know.    Cut back on sugars in the meantime.  I would get a flu shot each fall.    Take care.  Glad to see you.

## 2023-01-27 NOTE — Progress Notes (Unsigned)
CPE- See plan.  Routine anticipatory guidance given to patient.  See health maintenance.  The possibility exists that previously documented standard health maintenance information may have been brought forward from a previous encounter into this note.  If needed, that same information has been updated to reflect the current situation based on today's encounter.    Tetanus 2023 Flu encouraged.   PNA d/w pt.   Shingles d/w pt.   Cologuard neg 2023.   Prostate cancer screening and PSA options (with potential risks and benefits of testing vs not testing) were discussed along with recent recs/guidelines. He declined testing PSA at this point.  Diet and exercise d/w pt.  Living will d/w pt. Wife designated if patient were incapacitated. He prev had HCV and HIV screening with prev employment, per patient report.    Still using CPAP.    Diabetes:  No meds.   Hypoglycemic episodes: no sx Hyperglycemic episodes: no sx  Feet problems: no Blood Sugars averaging: not checked.  eye exam within last year: d/w pt.  Encouraged.   Diet and exercise d/w pt.   A1c 6.7, d/w pt.  He had been working on exercise but off diet.    Stressors d/w pt, re: his daughter.  She wanted to move out per patient report.  Per patient, she called DSS on patient.  He had DSS eval done.  Daughter has moved out in the meantime and they are not in contact.  He has court date pending to revoke parental rights.  He doesn't have charges pending.    Hypertension:    Using medication without problems or lightheadedness: yes Chest pain with exertion:no Edema:occ trace BLE edema Short of breath:no Labs d/w pt.    No more blood in stools.  Hasn't had to use suppositories.    PMH and SH reviewed  Meds, vitals, and allergies reviewed.   ROS: Per HPI.  Unless specifically indicated otherwise in HPI, the patient denies:  General: fever. Eyes: acute vision changes ENT: sore throat Cardiovascular: chest pain Respiratory:  SOB GI: vomiting GU: dysuria Musculoskeletal: acute back pain Derm: acute rash Neuro: acute motor dysfunction Psych: worsening mood Endocrine: polydipsia Heme: bleeding Allergy: hayfever  GEN: nad, alert and oriented HEENT: ncat NECK: supple w/o LA CV: rrr. PULM: ctab, no inc wob ABD: soft, +bs EXT: trace BLE edema SKIN: no acute rash  Diabetic foot exam: Normal inspection No skin breakdown No calluses  Normal DP pulses Normal sensation to light touch and monofilament Nails normal

## 2023-01-29 NOTE — Assessment & Plan Note (Signed)
Continue CPAP.  

## 2023-01-29 NOTE — Assessment & Plan Note (Signed)
Living will d/w pt.  Wife designated if patient were incapacitated.   ?

## 2023-01-29 NOTE — Assessment & Plan Note (Signed)
Continue HCTZ.  Labs d/w pt.

## 2023-01-29 NOTE — Assessment & Plan Note (Signed)
Diet and exercise d/w pt.   A1c 6.7, d/w pt.  He had been working on exercise but off diet.   See about getting a repeat A1c done at [facility] at their lab after 04/29/23.   If needing a [employer] order, then he can let me know.   He can work on diet in the meantime.  That should address the issue.  See AVS.

## 2023-01-29 NOTE — Assessment & Plan Note (Signed)
Tetanus 2023 Flu encouraged.   PNA d/w pt.   Shingles d/w pt.   Cologuard neg 2023.   Prostate cancer screening and PSA options (with potential risks and benefits of testing vs not testing) were discussed along with recent recs/guidelines. He declined testing PSA at this point.  Diet and exercise d/w pt.  Living will d/w pt. Wife designated if patient were incapacitated. He prev had HCV and HIV screening with prev employment, per patient report.

## 2023-01-29 NOTE — Assessment & Plan Note (Signed)
Stressors d/w pt, re: his daughter.  She wanted to move out per patient report.  Per patient, she called DSS on patient.  He had DSS eval done.  Daughter has moved out in the meantime and they are not in contact.  He has court date pending to revoke parental rights.  He doesn't have charges pending.  Support offered.

## 2023-04-03 LAB — HM DIABETES EYE EXAM

## 2023-09-14 ENCOUNTER — Encounter: Payer: Self-pay | Admitting: Family Medicine

## 2023-09-14 NOTE — Telephone Encounter (Signed)
 Care team updated and letter sent for eye exam notes.

## 2023-12-05 ENCOUNTER — Encounter: Payer: Self-pay | Admitting: Family Medicine

## 2023-12-05 ENCOUNTER — Ambulatory Visit: Payer: Self-pay | Admitting: Family Medicine

## 2023-12-05 VITALS — BP 138/74 | HR 72 | Temp 98.3°F | Ht 72.0 in | Wt 294.2 lb

## 2023-12-05 DIAGNOSIS — G4733 Obstructive sleep apnea (adult) (pediatric): Secondary | ICD-10-CM | POA: Diagnosis not present

## 2023-12-05 DIAGNOSIS — I700 Atherosclerosis of aorta: Secondary | ICD-10-CM | POA: Diagnosis not present

## 2023-12-05 DIAGNOSIS — E119 Type 2 diabetes mellitus without complications: Secondary | ICD-10-CM | POA: Diagnosis not present

## 2023-12-05 DIAGNOSIS — I10 Essential (primary) hypertension: Secondary | ICD-10-CM

## 2023-12-05 LAB — POCT GLYCOSYLATED HEMOGLOBIN (HGB A1C): Hemoglobin A1C: 6.7 % — AB (ref 4.0–5.6)

## 2023-12-05 MED ORDER — HYDROCHLOROTHIAZIDE 12.5 MG PO TABS: 12.5000 mg | ORAL_TABLET | Freq: Every day | ORAL | 3 refills | Status: AC

## 2023-12-05 MED ORDER — ATORVASTATIN CALCIUM 10 MG PO TABS: 10.0000 mg | ORAL_TABLET | Freq: Every day | ORAL | 3 refills | Status: AC

## 2023-12-05 MED ORDER — LISINOPRIL 40 MG PO TABS
40.0000 mg | ORAL_TABLET | Freq: Every day | ORAL | 3 refills | Status: AC
Start: 2023-12-05 — End: ?

## 2023-12-05 NOTE — Progress Notes (Unsigned)
 Hypertension:    Using medication without problems or lightheadedness: yes Chest pain with exertion:no Edema:only if prolonged sitting with driving.  Short of breath:no  Diabetes:  No meds.   Hypoglycemic episodes: no sx Hyperglycemic episodes: no sx Feet problems: no Blood Sugars averaging: 96-115 eye exam within last year: yes D/w pt about diet and exercise.  He is packing food.   A1c discussed with patient at office visit.  Diabetic based on A1c.  History of aortic atherosclerosis discussed with patient.  Rationale for statin use discussed with patient given this and history of diabetes.  His daughter is out of the house and his situation improved.    He is still using CPAP.  Compliant.    He is driving to [facility] and back 4 nights per week.    Meds, vitals, and allergies reviewed.   ROS: Per HPI unless specifically indicated in ROS section   GEN: nad, alert and oriented HEENT: ncat NECK: supple w/o LA CV: rrr. PULM: ctab, no inc wob ABD: soft, +bs EXT: no edema SKIN: Well-perfused  Diabetic foot exam: Normal inspection No skin breakdown No calluses  Normal DP pulses Normal sensation to light touch and monofilament Nails normal  31 minutes were devoted to patient care in this encounter (this includes time spent reviewing the patient's file/history, interviewing and examining the patient, counseling/reviewing plan with patient).   See after visit summary.

## 2023-12-05 NOTE — Patient Instructions (Addendum)
 Ask the lab at [facility] about getting your labs done. If you can't get that set up, let me know.   Recheck labs in about 6 months before a visit.   Try taking atorvastatin 10mg  before going to sleep.  If you have aches, then stop it and let me know.   Take care.  Glad to see you.

## 2023-12-06 NOTE — Assessment & Plan Note (Signed)
 Continue hydrochlorothiazide/lisinopril.

## 2023-12-06 NOTE — Assessment & Plan Note (Signed)
 Continue CPAP.

## 2023-12-06 NOTE — Assessment & Plan Note (Signed)
 No change in diabetic medications, in terms of lowering the sugar.  Continue work on diet and exercise.  Discussed.  Start statin, routine cautions given to patient.  See above.

## 2023-12-06 NOTE — Assessment & Plan Note (Signed)
 History of aortic atherosclerosis discussed with patient. Rationale for statin use discussed with patient given this and history of diabetes.  Start atorvastatin with routine cautions.

## 2024-02-26 ENCOUNTER — Encounter: Payer: Self-pay | Admitting: Family Medicine

## 2024-02-26 ENCOUNTER — Telehealth: Payer: Self-pay

## 2024-02-26 NOTE — Telephone Encounter (Signed)
 From: Estelita Helena Sent: 02/26/2024  12:51 PM EDT To: [facility] Triage; Celesta Coke Subject: FW: Appointment scheduled from MyChart         FYI: This call has been transferred to Access Nurse. Once the result note has been entered staff can address the message at that time.  Patient called in with the following symptoms:  Red Word: blurred vision, high BP   Please advise at [facility] [phone]  Message is routed to Provider Pool and [facility] Triage   Pt scheduled appt for labs on 6/4 & ov with [facility] on 6/12 for chest xray, blurred vision. Spoke to pt's wife, she states the pt was currently at work. Edwina Gram stated the pt had been experiencing blurred vision for about 2 weeks now. Edwina Gram stated the pt has been complaining about not being able to focus due to the blurriness. Edwina Gram stated about a few months ago, the pt secretly stopped taking his BP & fluid meds. Edwina Gram states since last week, the pt has been on meds. Edwina Gram stated at one point, the pt's BP was running high at 190/101. Edwina Gram declined triage. Offered sooner appt, Edwina Gram requested this Fri, 6/6, due to pt's work schedule. R/s ov for 6/6 @ 4pm with Vallarie Gauze. Edwina Gram requested a chest xray during time the pt comes in for labs on Wed? Didn't see any order for xray. Edwina Gram states [employer] & pt discussed a xray before. Please advise. ----- Message ----- From: Amelia Bair "Bill" Sent: 02/26/2024  11:50 AM EDT To: [facility] Admin Subject: Appointment scheduled from MyChart

## 2024-02-26 NOTE — Telephone Encounter (Addendum)
 About the CXR- that won't reliably show cardiac plaque build up.  I didn't order the CXR.  We can talk about it at the OV if needed.    He needs to be evaluated.  I wouldn't wait until Friday.  Please give him ER cautions.

## 2024-02-26 NOTE — Telephone Encounter (Signed)
 Can you please reach back out to patient. Dr Vallarie Gauze is not going to put in an order for the x-ray he would need to be seen first. But he can come in for his other labs. But he will not be getting an xray on Wednesday. Thank you

## 2024-02-26 NOTE — Telephone Encounter (Signed)
 Noted. Thanks.

## 2024-02-26 NOTE — Telephone Encounter (Signed)
 Copied from CRM [id number]. Topic: Clinical - Request for Lab/Test Order >> Feb 26, 2024 12:12 PM Rosamond Comes wrote: Reason for CRM: patient asking about  x-ray done before appt on 03/07/24  Please call patient about this [phone] ok to leave a detailed message

## 2024-02-26 NOTE — Telephone Encounter (Signed)
 See note below sent by mychart; I spoke with Edwina Gram (DPR signed) pt stopped taking lisinopril  and hydrochlorothiazide  end of March reason given "pt did not want to take meds".. For 2 wks pt complaining of blurred vision and problems focusing due to blurred vision.  On 02/19/24 BP 198/101 P ?Aaron Aas Pt restarted taking lisinopril  40 mg daily and hydrochlorothiazide  12.5 mg daily on 02/19/24. No change in vision. 02/26/24 BP 153/94 and 138/92 and 133/88. Edwina Gram said she thought pt may have had "myocardial infarction". Pt has not had any CP,SOB,H/A or dizziness.pt is not taking statin. Offered pt appt but LIsa said pt is working and first time pt can come in is 03/01/24 at 4PM.pt has appt on 02/28/24 for labs and Edwina Gram wants pt to have CXR when gets labs because of plaque buildup. UC & ED precautions given and Edwina Gram voiced understanding.Edwina Gram request cb after reviewed by Dr Vallarie Gauze to make sure pt can get CXR on Wed. Sending note to Dr Robyn Christ pool and will teams AV CMA.

## 2024-02-26 NOTE — Telephone Encounter (Signed)
 I spoke with Edwina Gram and notified her of Dr Harrel Lim comments and instruction and Edwina Gram said he could come late 02/27/24 to see Dr Vallarie Gauze; advised could schedule appt with different provider end of day tomorrow but Edwina Gram said pt will only see Dr Vallarie Gauze. I advised again that pt could go to UC or ED after leaving work but Edwina Gram said "pt was stubborn and won't go to UC or ED. " Edwina Gram said she would let pt know what Dr Vallarie Gauze said but Edwina Gram said not to cancel appt on 03/01/24. UC & ED precautions given again and Edwina Gram voiced understanding. sending note to Dr Vallarie Gauze and Vallarie Gauze pool.

## 2024-02-28 ENCOUNTER — Other Ambulatory Visit

## 2024-03-01 ENCOUNTER — Ambulatory Visit: Admitting: Family Medicine

## 2024-03-01 ENCOUNTER — Other Ambulatory Visit (INDEPENDENT_AMBULATORY_CARE_PROVIDER_SITE_OTHER)

## 2024-03-01 DIAGNOSIS — E119 Type 2 diabetes mellitus without complications: Secondary | ICD-10-CM

## 2024-03-02 LAB — COMPREHENSIVE METABOLIC PANEL WITH GFR
AG Ratio: 1.6 (calc) (ref 1.0–2.5)
ALT: 39 U/L (ref 9–46)
AST: 27 U/L (ref 10–35)
Albumin: 4.6 g/dL (ref 3.6–5.1)
Alkaline phosphatase (APISO): 57 U/L (ref 35–144)
BUN/Creatinine Ratio: 16 (calc) (ref 6–22)
BUN: 29 mg/dL — ABNORMAL HIGH (ref 7–25)
CO2: 21 mmol/L (ref 20–32)
Calcium: 9.5 mg/dL (ref 8.6–10.3)
Chloride: 97 mmol/L — ABNORMAL LOW (ref 98–110)
Creat: 1.76 mg/dL — ABNORMAL HIGH (ref 0.70–1.35)
Globulin: 2.9 g/dL (ref 1.9–3.7)
Glucose, Bld: 80 mg/dL (ref 65–99)
Potassium: 4.3 mmol/L (ref 3.5–5.3)
Sodium: 134 mmol/L — ABNORMAL LOW (ref 135–146)
Total Bilirubin: 1.1 mg/dL (ref 0.2–1.2)
Total Protein: 7.5 g/dL (ref 6.1–8.1)
eGFR: 43 mL/min/{1.73_m2} — ABNORMAL LOW (ref >=60)

## 2024-03-02 LAB — HEMOGLOBIN A1C
Hgb A1c MFr Bld: 6.4 % — ABNORMAL HIGH (ref <5.7)
Mean Plasma Glucose: 137 mg/dL
eAG (mmol/L): 7.6 mmol/L

## 2024-03-02 LAB — CBC
HCT: 48.7 % (ref 38.5–50.0)
Hemoglobin: 16.3 g/dL (ref 13.2–17.1)
MCH: 28.8 pg (ref 27.0–33.0)
MCHC: 33.5 g/dL (ref 32.0–36.0)
MCV: 86.2 fL (ref 80.0–100.0)
MPV: 10.6 fL (ref 7.5–12.5)
Platelets: 223 10*3/uL (ref 140–400)
RBC: 5.65 10*6/uL (ref 4.20–5.80)
RDW: 11.8 % (ref 11.0–15.0)
WBC: 6 10*3/uL (ref 3.8–10.8)

## 2024-03-02 LAB — MICROALBUMIN / CREATININE URINE RATIO
Creatinine, Urine: 179 mg/dL (ref 20–320)
Microalb Creat Ratio: 2 mg/g{creat} (ref <30)
Microalb, Ur: 0.4 mg/dL

## 2024-03-02 LAB — LIPID PANEL
Cholesterol: 244 mg/dL — ABNORMAL HIGH (ref <200)
HDL: 41 mg/dL (ref >=40)
LDL Cholesterol (Calc): 164 mg/dL — ABNORMAL HIGH
Non-HDL Cholesterol (Calc): 203 mg/dL — ABNORMAL HIGH (ref <130)
Total CHOL/HDL Ratio: 6 (calc) — ABNORMAL HIGH (ref <5.0)
Triglycerides: 220 mg/dL — ABNORMAL HIGH (ref <150)

## 2024-03-03 ENCOUNTER — Other Ambulatory Visit: Payer: Self-pay | Admitting: Family Medicine

## 2024-03-03 ENCOUNTER — Ambulatory Visit: Payer: Self-pay | Admitting: Family Medicine

## 2024-03-03 DIAGNOSIS — I10 Essential (primary) hypertension: Secondary | ICD-10-CM

## 2024-03-07 ENCOUNTER — Ambulatory Visit: Admitting: Family Medicine

## 2024-03-22 ENCOUNTER — Encounter: Payer: Self-pay | Admitting: Family Medicine

## 2024-03-22 ENCOUNTER — Ambulatory Visit: Admitting: Family Medicine

## 2024-03-22 VITALS — BP 144/92 | HR 70 | Temp 98.3°F | Resp 20 | Ht 72.0 in | Wt 299.1 lb

## 2024-03-22 DIAGNOSIS — H532 Diplopia: Secondary | ICD-10-CM

## 2024-03-22 DIAGNOSIS — E119 Type 2 diabetes mellitus without complications: Secondary | ICD-10-CM | POA: Diagnosis not present

## 2024-03-22 DIAGNOSIS — I10 Essential (primary) hypertension: Secondary | ICD-10-CM | POA: Diagnosis not present

## 2024-03-22 DIAGNOSIS — I700 Atherosclerosis of aorta: Secondary | ICD-10-CM | POA: Diagnosis not present

## 2024-03-22 DIAGNOSIS — Z8249 Family history of ischemic heart disease and other diseases of the circulatory system: Secondary | ICD-10-CM | POA: Diagnosis not present

## 2024-03-22 NOTE — Addendum Note (Signed)
 Addended by: ISADORA RAISIN on: 03/22/2024 04:34 PM   Modules accepted: Orders

## 2024-03-22 NOTE — Patient Instructions (Addendum)
 Go to the lab on the way out.   If you have mychart we'll likely use that to update you.    Take care.  Glad to see you.  If you are lightheaded, then cut the lisinopril  in half.  Update me as needed.   Refer to cards.  Labs today to eval for myasthenia gravis.

## 2024-03-22 NOTE — Progress Notes (Unsigned)
 Follow up creatinine pending.  D/w pt.   He was lightheaded once, d/w pt about potentially lowering his lisinopril  use.    He had episodic horizontal double vision with prolonged work shift.  He has eye clinic f/u pending. No grip changes.  No lid lag, no dysphagia.    Diabetes:  No meds.  Labs d/w pt.  Hypoglycemic episodes: no  Hyperglycemic episodes:no Feet problems:no Blood Sugars averaging: usually ~150s after eating, then back to 120s after that.  Statin use d/w pt.  Labs d/w pt.  He was fasting on labs and that could have affected his Cr.    He has ankle swelling after prolonged driving.  Resolves after getting out of his truck.    No aleve or ibuprofen .    Meds, vitals, and allergies reviewed.   ROS: Per HPI unless specifically indicated in ROS section   GEN: nad, alert and oriented HEENT: mucous membranes moist NECK: supple w/o LA CV: rrr. PULM: ctab, no inc wob ABD: soft, +bs EXT: no edema SKIN: no acute rash CN 2-12 wnl. S/s wnl x4.  Normal cerebellar testing.   Normal accomodation, no double vision

## 2024-03-24 ENCOUNTER — Ambulatory Visit: Payer: Self-pay | Admitting: Family Medicine

## 2024-03-24 DIAGNOSIS — H532 Diplopia: Secondary | ICD-10-CM | POA: Insufficient documentation

## 2024-03-24 DIAGNOSIS — Z8249 Family history of ischemic heart disease and other diseases of the circulatory system: Secondary | ICD-10-CM | POA: Insufficient documentation

## 2024-03-24 NOTE — Assessment & Plan Note (Signed)
 See above

## 2024-03-24 NOTE — Assessment & Plan Note (Signed)
 With history of abnormal creatinine.  Recheck creatinine pending.  See notes on labs.  Discussed cutting his lisinopril  in half if he is lightheaded.

## 2024-03-24 NOTE — Assessment & Plan Note (Signed)
 Episodic, discussed possibility of ocular myasthenia gravis.  Routine cautions given to patient.  Normal neurologic exam now.  See notes on labs.

## 2024-03-24 NOTE — Assessment & Plan Note (Signed)
 Blood Sugars averaging: usually ~150s after eating, then back to 120s after that.  Statin use d/w pt.  Labs d/w pt.  He was fasting on labs and that could have affected his Cr, ie with relative dehydration. Continue work on diet and exercise.

## 2024-03-24 NOTE — Assessment & Plan Note (Signed)
 Discussed not getting follow-up chest x-ray to follow-up aortic atherosclerosis.  Discussed taking a statin with routine instructions.  Family history of coronary disease.  Refer to cardiology.  He agreed.

## 2024-04-01 ENCOUNTER — Encounter: Payer: Self-pay | Admitting: Family Medicine

## 2024-04-02 NOTE — Telephone Encounter (Signed)
 Please see mychart message about the referral.    please have the referring cardiologist to call Lisa's number to set up an appointment due to my work schedule. Her number is [phone].  Thanks.

## 2024-04-12 LAB — MYASTHENIA GRAVIS PROFILE
AChR Binding Ab, Serum: <0.07 nmol/L (ref 0.00–0.24)
AChR-modulating Ab: 4 % (ref 0–45)
Acetylchol Block Ab: 14 % (ref 0–25)
Anti-striation Abs: NEGATIVE

## 2024-04-12 LAB — BASIC METABOLIC PANEL WITH GFR
BUN/Creatinine Ratio: 10 (ref 10–24)
BUN: 12 mg/dL (ref 8–27)
CO2: 17 mmol/L — ABNORMAL LOW (ref 20–29)
Calcium: 9.5 mg/dL (ref 8.6–10.2)
Chloride: 105 mmol/L (ref 96–106)
Creatinine, Ser: 1.19 mg/dL (ref 0.76–1.27)
Glucose: 95 mg/dL (ref 70–99)
Potassium: 4.3 mmol/L (ref 3.5–5.2)
Sodium: 142 mmol/L (ref 134–144)
eGFR: 69 mL/min/1.73 (ref >59)

## 2024-04-12 LAB — MUSK ANTIBODIES: MuSK Antibodies: <1 U/mL

## 2024-07-03 ENCOUNTER — Ambulatory Visit: Attending: Cardiology | Admitting: Cardiology
